# Patient Record
Sex: Female | Born: 1955 | Race: White | Hispanic: No | Marital: Married | Smoking: Never smoker
Health system: Southern US, Community
[De-identification: ages and names within clinical notes are randomized; demographics above are authoritative.]

## PROBLEM LIST (undated history)

## (undated) DIAGNOSIS — E079 Disorder of thyroid, unspecified: Secondary | ICD-10-CM

## (undated) DIAGNOSIS — F32A Depression, unspecified: Secondary | ICD-10-CM

## (undated) DIAGNOSIS — F419 Anxiety disorder, unspecified: Secondary | ICD-10-CM

## (undated) DIAGNOSIS — F329 Major depressive disorder, single episode, unspecified: Secondary | ICD-10-CM

## (undated) HISTORY — PX: REDUCTION MAMMAPLASTY: SUR839

## (undated) HISTORY — DX: Disorder of thyroid, unspecified: E07.9

## (undated) HISTORY — DX: Anxiety disorder, unspecified: F41.9

## (undated) HISTORY — DX: Major depressive disorder, single episode, unspecified: F32.9

## (undated) HISTORY — DX: Depression, unspecified: F32.A

---

## 1988-10-18 HISTORY — PX: COSMETIC SURGERY: SHX468

## 2000-10-18 HISTORY — PX: EYE SURGERY: SHX253

## 2000-10-18 HISTORY — PX: GASTRIC BYPASS: SHX52

## 2001-10-18 HISTORY — PX: COSMETIC SURGERY: SHX468

## 2003-10-19 DIAGNOSIS — E079 Disorder of thyroid, unspecified: Secondary | ICD-10-CM

## 2003-10-19 HISTORY — DX: Disorder of thyroid, unspecified: E07.9

## 2014-03-04 ENCOUNTER — Encounter: Payer: Self-pay | Admitting: *Deleted

## 2014-03-20 ENCOUNTER — Encounter: Payer: Self-pay | Admitting: *Deleted

## 2014-03-20 ENCOUNTER — Encounter: Payer: Self-pay | Admitting: Family Medicine

## 2014-03-20 ENCOUNTER — Ambulatory Visit (INDEPENDENT_AMBULATORY_CARE_PROVIDER_SITE_OTHER): Payer: 59 | Admitting: Family Medicine

## 2014-03-20 VITALS — BP 128/84 | HR 62 | Temp 97.5°F | Resp 12 | Ht 64.5 in | Wt 243.0 lb

## 2014-03-20 DIAGNOSIS — E669 Obesity, unspecified: Secondary | ICD-10-CM | POA: Insufficient documentation

## 2014-03-20 DIAGNOSIS — Z1231 Encounter for screening mammogram for malignant neoplasm of breast: Secondary | ICD-10-CM

## 2014-03-20 DIAGNOSIS — G8929 Other chronic pain: Secondary | ICD-10-CM

## 2014-03-20 DIAGNOSIS — Z0000 Encounter for general adult medical examination without abnormal findings: Secondary | ICD-10-CM

## 2014-03-20 DIAGNOSIS — M549 Dorsalgia, unspecified: Secondary | ICD-10-CM

## 2014-03-20 DIAGNOSIS — E039 Hypothyroidism, unspecified: Secondary | ICD-10-CM

## 2014-03-20 MED ORDER — HYDROCODONE-ACETAMINOPHEN 5-325 MG PO TABS: 1.0000 | ORAL_TABLET | Freq: Four times a day (QID) | ORAL | Status: DC | PRN

## 2014-03-20 NOTE — Assessment & Plan Note (Addendum)
PAP due in 2017 Colonoscopy UTD Obtain records from previous PCP/GYN Fasting labs pt will return for Schedule Mammogram

## 2014-03-20 NOTE — Progress Notes (Signed)
Patient ID: Katherine Woodward, female   DOB: 07/29/1956, 58 y.o.   MRN: [id number]   Subjective:    Patient ID: Katherine Woodward, female    DOB: 1956-08-01, 58 y.o.   MRN: [id number]  Patient presents for New Patient CPE and Medication refill  patient here for new physical exam. She moved here from [place] about 6 months ago. Her previous PCP was Dr. Rosary Lively. She was also followed by GYN and her Pap smear was done in January 2014 she is on a three-year cycle. She has had an ablation and is now in menopause. Medications and history reviewed History of mood disorder she's been on Wellbutrin since age 69 she would have anger issues as well as a lot of stressors and this is the only medication is help. At age 78 she was started on Zoloft secondary to hormonal changes which also affected her mood and some mild anxiety. She's wondering if she will be able to come off of this medication soon. History of hypothyroidism diagnosed about 7 years ago she's never had a biopsy she's been on levothyroxine and has been stable on her current dose for the past couple of years.  Chronic back pain she is history of chronic back pain since her 7s. She takes Flexeril when her back flares up on her she also has hydrocodone at home which she typically gets 30 tablets for the year she does not have that many flares. She also has episodes of bursitis in her right hip which she takes omega-3 fatty acids   She's had an early bone density which is been normal. She also had a colonoscopy at age 9 which was normal. She believes that she did have her tetanus booster.  Obesity-she had gastric bypass surgery back in 2002, she went down to approximately 170 pounds. Should not have any complications from the surgery. Over the past 5 years she has gained back all of her weight. She contributes this to stressors with work and scheduling difficulties where she stopped working out and watching her eating habits as closely. She works from home her  husband's job relocated them to [place]:  GEN- denies fatigue, fever, weight loss,weakness, recent illness HEENT- denies eye drainage, change in vision, nasal discharge, CVS- denies chest pain, palpitations RESP- denies SOB, cough, wheeze ABD- denies N/V, change in stools, abd pain GU- denies dysuria, hematuria, dribbling, incontinence MSK- + joint pain, muscle aches, injury Neuro- denies headache, dizziness, syncope, seizure activity       Objective:    BP 128/84  Pulse 62  Temp(Src) 97.5 F (36.4 C) (Oral)  Resp 12  Ht 5' 4.5" (1.638 m)  Wt 243 lb (110.224 kg)  BMI 41.08 kg/m2 GEN- NAD, alert and oriented x3,obese HEENT- PERRL, EOMI, non injected sclera, pink conjunctiva, MMM, oropharynx clear, fundus benign, good dentition Neck- Supple, no thyromegaly CVS- RRR, no murmur RESP-CTAB ABD-NABS,soft,NT,ND MSK- Good ROM Upper and Lower ext, Spine NT EXT- No edema Psych- normal affect and mood Pulses- Radial, DP- 2+        Assessment & Plan:      Problem List Items Addressed This Visit   None      Note: This dictation was prepared with Dragon dictation along with smaller phrase technology. Any transcriptional errors that result from this process are unintentional.

## 2014-03-20 NOTE — Assessment & Plan Note (Signed)
Given script for 30 tabs norco Has flexeril at home

## 2014-03-20 NOTE — Assessment & Plan Note (Signed)
Check TFT, needs every 6 months

## 2014-03-20 NOTE — Assessment & Plan Note (Signed)
Discussed diet and exercise 

## 2014-03-20 NOTE — Patient Instructions (Signed)
Release of records- Dr. Rosary Lively  Continue current medications Mammogram to be scheduled Return for fasting labs  F/U 6 months

## 2014-03-25 ENCOUNTER — Other Ambulatory Visit: Payer: 59

## 2014-03-25 DIAGNOSIS — Z0000 Encounter for general adult medical examination without abnormal findings: Secondary | ICD-10-CM

## 2014-03-25 DIAGNOSIS — E039 Hypothyroidism, unspecified: Secondary | ICD-10-CM

## 2014-03-25 LAB — T4, FREE: Free T4: 1.06 ng/dL (ref 0.80–1.80)

## 2014-03-25 LAB — CBC WITH DIFFERENTIAL/PLATELET
Basophils Absolute: 0.1 10*3/uL (ref 0.0–0.1)
Basophils Relative: 1 % (ref 0–1)
Eosinophils Absolute: 0.1 10*3/uL (ref 0.0–0.7)
Eosinophils Relative: 2 % (ref 0–5)
HEMATOCRIT: 37.6 % (ref 36.0–46.0)
Hemoglobin: 11.8 g/dL — ABNORMAL LOW (ref 12.0–15.0)
Lymphocytes Relative: 23 % (ref 12–46)
Lymphs Abs: 1.5 10*3/uL (ref 0.7–4.0)
MCH: 24.8 pg — ABNORMAL LOW (ref 26.0–34.0)
MCHC: 31.4 g/dL (ref 30.0–36.0)
MCV: 79.2 fL (ref 78.0–100.0)
MONOS PCT: 5 % (ref 3–12)
Monocytes Absolute: 0.3 10*3/uL (ref 0.1–1.0)
Neutro Abs: 4.4 10*3/uL (ref 1.7–7.7)
Neutrophils Relative %: 69 % (ref 43–77)
Platelets: 366 10*3/uL (ref 150–400)
RBC: 4.75 MIL/uL (ref 3.87–5.11)
RDW: 15.8 % — AB (ref 11.5–15.5)
WBC: 6.4 10*3/uL (ref 4.0–10.5)

## 2014-03-25 LAB — LIPID PANEL
CHOLESTEROL: 245 mg/dL — AB (ref 0–200)
HDL: 69 mg/dL (ref >39)
LDL Cholesterol: 156 mg/dL — ABNORMAL HIGH (ref 0–99)
Total CHOL/HDL Ratio: 3.6 Ratio
Triglycerides: 101 mg/dL (ref <150)
VLDL: 20 mg/dL (ref 0–40)

## 2014-03-25 LAB — COMPREHENSIVE METABOLIC PANEL
ALBUMIN: 3.9 g/dL (ref 3.5–5.2)
ALT: 14 U/L (ref 0–35)
AST: 15 U/L (ref 0–37)
Alkaline Phosphatase: 133 U/L — ABNORMAL HIGH (ref 39–117)
BUN: 17 mg/dL (ref 6–23)
CHLORIDE: 103 meq/L (ref 96–112)
CO2: 27 meq/L (ref 19–32)
Calcium: 9.2 mg/dL (ref 8.4–10.5)
Creat: 0.92 mg/dL (ref 0.50–1.10)
Glucose, Bld: 94 mg/dL (ref 70–99)
POTASSIUM: 4.5 meq/L (ref 3.5–5.3)
SODIUM: 138 meq/L (ref 135–145)
TOTAL PROTEIN: 7.2 g/dL (ref 6.0–8.3)
Total Bilirubin: 0.3 mg/dL (ref 0.2–1.2)

## 2014-03-25 LAB — TSH: TSH: 2.523 u[IU]/mL (ref 0.350–4.500)

## 2014-03-25 LAB — T3, FREE: T3, Free: 2.7 pg/mL (ref 2.3–4.2)

## 2014-04-01 ENCOUNTER — Other Ambulatory Visit: Payer: Self-pay | Admitting: *Deleted

## 2014-04-01 MED ORDER — SERTRALINE HCL 50 MG PO TABS: 50.0000 mg | ORAL_TABLET | Freq: Every day | ORAL | Status: DC

## 2014-04-01 MED ORDER — LEVOTHYROXINE SODIUM 112 MCG PO TABS: 112.0000 ug | ORAL_TABLET | Freq: Every day | ORAL | Status: DC

## 2014-04-01 MED ORDER — BUPROPION HCL ER (XL) 300 MG PO TB24: 300.0000 mg | ORAL_TABLET | Freq: Every day | ORAL | Status: DC

## 2014-04-03 ENCOUNTER — Telehealth: Payer: Self-pay | Admitting: *Deleted

## 2014-04-03 NOTE — Telephone Encounter (Signed)
Message copied by Sheral Flow on Wed Apr 03, 2014 11:46 AM ------      Message from: Devoria Glassing      Created: Wed Apr 03, 2014 11:36 AM       Patient would like to speak with you about her last office visit       Please call her at [phone] ------

## 2014-04-03 NOTE — Telephone Encounter (Signed)
Call returned to patient.   States that she has questions about her bill. Her insurance was billed $241 for her last visit (new patient CPE).   Advised that she would need to speak with office manager, but that he is currently on vacation.

## 2014-04-18 ENCOUNTER — Ambulatory Visit (HOSPITAL_COMMUNITY): Payer: Self-pay

## 2014-04-25 ENCOUNTER — Ambulatory Visit (HOSPITAL_COMMUNITY)
Admission: RE | Admit: 2014-04-25 | Discharge: 2014-04-25 | Disposition: A | Discharge status: Home / Self Care | Payer: 59 | Source: Ambulatory Visit | Attending: Family Medicine | Admitting: Family Medicine

## 2014-04-25 DIAGNOSIS — Z1231 Encounter for screening mammogram for malignant neoplasm of breast: Secondary | ICD-10-CM | POA: Insufficient documentation

## 2014-04-25 NOTE — Addendum Note (Signed)
Addended by: Vic Blackbird F on: 04/25/2014 12:33 AM   Modules accepted: Level of Service

## 2014-09-20 ENCOUNTER — Ambulatory Visit: Payer: 59 | Admitting: Family Medicine

## 2015-03-14 ENCOUNTER — Other Ambulatory Visit: Payer: Managed Care, Other (non HMO)

## 2015-03-14 DIAGNOSIS — E039 Hypothyroidism, unspecified: Secondary | ICD-10-CM

## 2015-03-14 DIAGNOSIS — Z79899 Other long term (current) drug therapy: Secondary | ICD-10-CM

## 2015-03-14 DIAGNOSIS — F39 Unspecified mood [affective] disorder: Secondary | ICD-10-CM

## 2015-03-14 LAB — COMPLETE METABOLIC PANEL WITH GFR
ALT: 11 U/L (ref 0–35)
AST: 13 U/L (ref 0–37)
Albumin: 3.8 g/dL (ref 3.5–5.2)
Alkaline Phosphatase: 120 U/L — ABNORMAL HIGH (ref 39–117)
BUN: 18 mg/dL (ref 6–23)
CO2: 27 mEq/L (ref 19–32)
CREATININE: 0.78 mg/dL (ref 0.50–1.10)
Calcium: 9.1 mg/dL (ref 8.4–10.5)
Chloride: 102 mEq/L (ref 96–112)
GFR, EST NON AFRICAN AMERICAN: 85 mL/min
GFR, Est African American: >89 mL/min
Glucose, Bld: 103 mg/dL — ABNORMAL HIGH (ref 70–99)
Potassium: 4.2 mEq/L (ref 3.5–5.3)
Sodium: 136 mEq/L (ref 135–145)
Total Bilirubin: 0.4 mg/dL (ref 0.2–1.2)
Total Protein: 7.1 g/dL (ref 6.0–8.3)

## 2015-03-14 LAB — LIPID PANEL
CHOL/HDL RATIO: 3.7 ratio
Cholesterol: 233 mg/dL — ABNORMAL HIGH (ref 0–200)
HDL: 63 mg/dL (ref >=46)
LDL Cholesterol: 146 mg/dL — ABNORMAL HIGH (ref 0–99)
TRIGLYCERIDES: 120 mg/dL (ref <150)
VLDL: 24 mg/dL (ref 0–40)

## 2015-03-14 LAB — CBC WITH DIFFERENTIAL/PLATELET
BASOS PCT: 1 % (ref 0–1)
Basophils Absolute: 0.1 10*3/uL (ref 0.0–0.1)
EOS ABS: 0.1 10*3/uL (ref 0.0–0.7)
EOS PCT: 2 % (ref 0–5)
HCT: 36.8 % (ref 36.0–46.0)
HEMOGLOBIN: 11.2 g/dL — AB (ref 12.0–15.0)
LYMPHS ABS: 1.5 10*3/uL (ref 0.7–4.0)
Lymphocytes Relative: 25 % (ref 12–46)
MCH: 24.7 pg — ABNORMAL LOW (ref 26.0–34.0)
MCHC: 30.4 g/dL (ref 30.0–36.0)
MCV: 81.1 fL (ref 78.0–100.0)
MONO ABS: 0.4 10*3/uL (ref 0.1–1.0)
MPV: 10.5 fL (ref 8.6–12.4)
Monocytes Relative: 6 % (ref 3–12)
Neutro Abs: 3.9 10*3/uL (ref 1.7–7.7)
Neutrophils Relative %: 66 % (ref 43–77)
Platelets: 354 10*3/uL (ref 150–400)
RBC: 4.54 MIL/uL (ref 3.87–5.11)
RDW: 16 % — ABNORMAL HIGH (ref 11.5–15.5)
WBC: 5.9 10*3/uL (ref 4.0–10.5)

## 2015-03-14 LAB — TSH: TSH: 2.394 u[IU]/mL (ref 0.350–4.500)

## 2015-03-26 ENCOUNTER — Encounter: Payer: Self-pay | Admitting: Family Medicine

## 2015-03-26 ENCOUNTER — Ambulatory Visit (INDEPENDENT_AMBULATORY_CARE_PROVIDER_SITE_OTHER): Payer: Managed Care, Other (non HMO) | Admitting: Family Medicine

## 2015-03-26 VITALS — BP 128/78 | HR 64 | Temp 97.7°F | Resp 14 | Ht 65.0 in | Wt 253.0 lb

## 2015-03-26 DIAGNOSIS — F39 Unspecified mood [affective] disorder: Secondary | ICD-10-CM | POA: Diagnosis not present

## 2015-03-26 DIAGNOSIS — Z124 Encounter for screening for malignant neoplasm of cervix: Secondary | ICD-10-CM

## 2015-03-26 DIAGNOSIS — G8929 Other chronic pain: Secondary | ICD-10-CM

## 2015-03-26 DIAGNOSIS — N898 Other specified noninflammatory disorders of vagina: Secondary | ICD-10-CM | POA: Diagnosis not present

## 2015-03-26 DIAGNOSIS — Z1239 Encounter for other screening for malignant neoplasm of breast: Secondary | ICD-10-CM

## 2015-03-26 DIAGNOSIS — Z0000 Encounter for general adult medical examination without abnormal findings: Secondary | ICD-10-CM

## 2015-03-26 DIAGNOSIS — M549 Dorsalgia, unspecified: Secondary | ICD-10-CM

## 2015-03-26 DIAGNOSIS — E038 Other specified hypothyroidism: Secondary | ICD-10-CM | POA: Diagnosis not present

## 2015-03-26 DIAGNOSIS — Z23 Encounter for immunization: Secondary | ICD-10-CM | POA: Diagnosis not present

## 2015-03-26 LAB — WET PREP FOR TRICH, YEAST, CLUE
Clue Cells Wet Prep HPF POC: NONE SEEN
Trich, Wet Prep: NONE SEEN
Yeast Wet Prep HPF POC: NONE SEEN

## 2015-03-26 MED ORDER — PHENTERMINE HCL 37.5 MG PO TABS: 37.5000 mg | ORAL_TABLET | Freq: Every day | ORAL | Status: DC

## 2015-03-26 MED ORDER — SERTRALINE HCL 25 MG PO TABS: 25.0000 mg | ORAL_TABLET | Freq: Every day | ORAL | Status: DC

## 2015-03-26 NOTE — Assessment & Plan Note (Signed)
CPE done, TDAP given, PAP Smear done, Mammo to be scheduled, reviewed fasting labs

## 2015-03-26 NOTE — Patient Instructions (Signed)
I recommend eye visit once a year I recommend dental visit every 6 months Goal is to  Exercise 30 minutes 5 days a week We will send a letter with lab results  Start phentermine- start with 1/2 tablet for 2 weeks, in the morning, then increase to 1 full tablet For zoloft- we are going to taper off, decrease to 1/2 tablet ( 25mg ) for 2 weeks, then take 1/2 tablet of the 25mg ( 12.5mg ) for 2 weeks, then stop TDAP given Schedule Mammogram in July I recommend Multivitamin once a day  F/U  8 weeks

## 2015-03-26 NOTE — Assessment & Plan Note (Signed)
TFT wnl no change to dose

## 2015-03-26 NOTE — Progress Notes (Signed)
Patient ID: Katherine Woodward, female   DOB: Feb 10, 1956, 59 y.o.   MRN: [id number]   Subjective:    Patient ID: Katherine Woodward, female    DOB: [birth year], 59 y.o.   MRN: [id number]  Patient presents for CPE with PAP  Last PAP 2 years ago, history of ablation for DUB. Mammogram due in July, colonoscopy UTD. Due for TDAP today [name] labs reviewed She would like to taper off zoloft, doing well with mood, has settled here well. Will continue taking Wellbutrin  Obesity- has gained 10lbs since our last visit, works from home, does not exercise, tries to follow her nutrition plan given by bariatric clinic but does drink too much soda and at times snacks. 24 recall- egg, muffin cheese, breafast- lunch half a sandwhich/fruit,,  Had mid evening snack, chicken veggies for lunch. Would like to try a weight loss pill to help her metabolism    Review Of Systems:  GEN- denies fatigue, fever, weight loss,weakness, recent illness HEENT- denies eye drainage, change in vision, nasal discharge, CVS- denies chest pain, palpitations RESP- denies SOB, cough, wheeze ABD- denies N/V, change in stools, abd pain GU- denies dysuria, hematuria, dribbling, incontinence MSK- denies joint pain, muscle aches, injury Neuro- denies headache, dizziness, syncope, seizure activity       Objective:    BP 128/78 mmHg  Pulse 64  Temp(Src) 97.7 F (36.5 C) (Oral)  Resp 14  Ht 5\' 5"  (1.651 m)  Wt 253 lb (114.76 kg)  BMI 42.10 kg/m2 GEN- NAD, alert and oriented x3,obese  HEENT- PERRL, EOMI, non injected sclera, pink conjunctiva, MMM, oropharynx clear Neck- Supple, no thyromegaly Breast- normal symmetry, no nipple inversion,no nipple drainage, no nodules or lumps felt Nodes- no axillary nodes CVS- RRR, no murmur RESP-CTAB ABD-NABS,soft,NT,ND GU- normal external genitalia, vaginal mucosa pink and moist, cervix partially  visualized no growth, tissue appeared a inflammed,( exam unconvertable for pt )no blood form os, +  discharge, no CMT, no ovarian masses, uterus difficult to palpate due to habitus Rectum- Normal tone, FOBT negat, Skin- muiltple moles, Seb Keratosis, has bruise 2cm Right upper ext  Psych- normal affect and mood EXT- No edema Pulses- Radial, DP- 2+        Assessment & Plan:      Problem List Items Addressed This Visit    Routine general medical examination at a health care facility - Primary   Morbid obesity   Relevant Medications   phentermine (ADIPEX-P) 37.5 MG tablet   Mood disorder   Hypothyroidism   Chronic back pain    Other Visit Diagnoses    Breast cancer screening        Relevant Orders    MM DIGITAL SCREENING BILATERAL    Cervical cancer screening        Relevant Orders    PAP, ThinPrep ASCUS Rflx HPV Rflx Type    Vaginal discharge        Wet prep negative    Relevant Orders    WET PREP FOR Katherine Woodward, YEAST, CLUE (Completed)    Need for prophylactic vaccination with combined diphtheria-tetanus-pertussis (DTP) vaccine           Note: This dictation was prepared with Dragon dictation along with smaller phrase technology. Any transcriptional errors that result from this process are unintentional.

## 2015-03-26 NOTE — Assessment & Plan Note (Signed)
Status post gastric bypass in the past now is regaining weight. Will give her trial of phentermine we also discussed calorie counting and she will use that For this. We will follow-up in interim on her weight loss. She is also to walk

## 2015-03-26 NOTE — Assessment & Plan Note (Signed)
We will taper her off of the Zoloft and continue with the Wellbutrin. Taper is plan to decrease her by 50% every couple weeks

## 2015-03-27 ENCOUNTER — Other Ambulatory Visit: Payer: Self-pay | Admitting: Family Medicine

## 2015-03-27 LAB — PAP THINPREP ASCUS RFLX HPV RFLX TYPE

## 2015-03-27 NOTE — Telephone Encounter (Signed)
Refill denied.   Patient is tapering off Zoloft.   New tapering dose sent to pharmacy on 03/26/2015.

## 2015-04-01 ENCOUNTER — Telehealth: Payer: Self-pay | Admitting: Family Medicine

## 2015-04-01 ENCOUNTER — Other Ambulatory Visit: Payer: Self-pay | Admitting: Family Medicine

## 2015-04-01 DIAGNOSIS — Z1231 Encounter for screening mammogram for malignant neoplasm of breast: Secondary | ICD-10-CM

## 2015-04-01 NOTE — Telephone Encounter (Signed)
noted 

## 2015-04-01 NOTE — Telephone Encounter (Signed)
[facility] calling to let us know that they sent a new order over for this patient that needs to be esigned  [phone] if any questions

## 2015-04-18 ENCOUNTER — Other Ambulatory Visit: Payer: Self-pay | Admitting: Family Medicine

## 2015-04-18 NOTE — Telephone Encounter (Signed)
LRF hydrocodone 03/21/15 #30  OK refill?

## 2015-04-18 NOTE — Telephone Encounter (Signed)
Okay to refill? 

## 2015-04-19 ENCOUNTER — Encounter: Payer: Self-pay | Admitting: Family Medicine

## 2015-04-22 ENCOUNTER — Other Ambulatory Visit: Payer: Self-pay | Admitting: Family Medicine

## 2015-04-22 MED ORDER — HYDROCODONE-ACETAMINOPHEN 5-325 MG PO TABS: 1.0000 | ORAL_TABLET | Freq: Four times a day (QID) | ORAL | Status: DC | PRN

## 2015-04-22 MED ORDER — SERTRALINE HCL 25 MG PO TABS: 25.0000 mg | ORAL_TABLET | Freq: Every day | ORAL | Status: DC

## 2015-04-23 NOTE — Telephone Encounter (Signed)
Medication refilled per protocol. 

## 2015-05-05 ENCOUNTER — Ambulatory Visit (HOSPITAL_COMMUNITY): Payer: Self-pay

## 2015-05-05 ENCOUNTER — Ambulatory Visit
Admission: RE | Admit: 2015-05-05 | Discharge: 2015-05-05 | Disposition: A | Discharge status: Home / Self Care | Payer: Managed Care, Other (non HMO) | Source: Ambulatory Visit | Attending: Family Medicine | Admitting: Family Medicine

## 2015-05-05 DIAGNOSIS — Z1239 Encounter for other screening for malignant neoplasm of breast: Secondary | ICD-10-CM

## 2015-05-21 ENCOUNTER — Ambulatory Visit (INDEPENDENT_AMBULATORY_CARE_PROVIDER_SITE_OTHER): Payer: Managed Care, Other (non HMO) | Admitting: Family Medicine

## 2015-05-21 ENCOUNTER — Encounter: Payer: Self-pay | Admitting: Family Medicine

## 2015-05-21 DIAGNOSIS — F39 Unspecified mood [affective] disorder: Secondary | ICD-10-CM

## 2015-05-21 MED ORDER — SERTRALINE HCL 50 MG PO TABS: 50.0000 mg | ORAL_TABLET | Freq: Every day | ORAL | Status: DC

## 2015-05-21 NOTE — Assessment & Plan Note (Signed)
Continue zoloft at 50mg , along with wellbutrin

## 2015-05-21 NOTE — Progress Notes (Signed)
Patient ID: Katherine Woodward, female   DOB: 11-27-1955, 59 y.o.   MRN: [id number]   Subjective:    Patient ID: Katherine Woodward, female    DOB: 1955/11/28, 59 y.o.   MRN: [id number]  Patient presents for 2 month F/U  patient here to follow-up weight. She started phentermine however it starts that make her a little short of breath with exercise and she would get very fatigued when she came off of the medication. She took for 5 weeks the past 3 weeks she has been off of the medication and she has been maintaining around a 1200-calorie diet. She has lost 17 pounds. She is doing aerobic exercise 3 days a week.  With regards to the Zoloft to try to taper off the found that it made her more anxious therefore she tapered back up to 50 mg somewhat to stay on this dose.  She is traveling out of the country in March she needs some help with travel vaccinations.  Review Of Systems:  GEN- denies fatigue, fever, weight loss,weakness, recent illness HEENT- denies eye drainage, change in vision, nasal discharge, CVS- denies chest pain, palpitations RESP- denies SOB, cough, wheeze ABD- denies N/V, change in stools, abd pain GU- denies dysuria, hematuria, dribbling, incontinence MSK- denies joint pain, muscle aches, injury Neuro- denies headache, dizziness, syncope, seizure activity       Objective:    BP 120/64 mmHg  Pulse 72  Temp(Src) 97.9 F (36.6 C) (Oral)  Resp 12  Ht 5\' 5"  (1.651 m)  Wt 236 lb (107.049 kg)  BMI 39.27 kg/m2 GEN- NAD, alert and oriented x3 Psych- normal affect and mood       Assessment & Plan:      Problem List Items Addressed This Visit    Morbid obesity - Primary    Encouraged to continue with weight loss, calorie counting , increase aerobic exercise to 5 days a week D/c phentermine due to side effects      Mood disorder    Continue zoloft at 50mg , along with wellbutrin         Note: This dictation was prepared with Dragon dictation along with smaller phrase  technology. Any transcriptional errors that result from this process are unintentional.

## 2015-05-21 NOTE — Patient Instructions (Addendum)
Continue with your weight loss and calorie counting  Continue zoloft 50mg   F/U June for physical or as needed

## 2015-05-21 NOTE — Assessment & Plan Note (Signed)
Encouraged to continue with weight loss, calorie counting , increase aerobic exercise to 5 days a week D/c phentermine due to side effects

## 2015-05-23 ENCOUNTER — Encounter: Payer: Self-pay | Admitting: Family Medicine

## 2015-05-24 ENCOUNTER — Other Ambulatory Visit: Payer: Self-pay | Admitting: Family Medicine

## 2015-05-26 NOTE — Telephone Encounter (Signed)
Refill appropriate and filled per protocol. 

## 2015-06-12 ENCOUNTER — Encounter: Payer: Self-pay | Admitting: Family Medicine

## 2015-06-12 MED ORDER — CYCLOBENZAPRINE HCL 10 MG PO TABS: 10.0000 mg | ORAL_TABLET | Freq: Every day | ORAL | Status: DC

## 2015-11-07 ENCOUNTER — Encounter: Payer: Self-pay | Admitting: Family Medicine

## 2015-11-10 MED ORDER — PHENTERMINE HCL 37.5 MG PO TABS: 37.5000 mg | ORAL_TABLET | Freq: Every day | ORAL | Status: DC

## 2016-01-26 ENCOUNTER — Telehealth: Payer: Self-pay | Admitting: *Deleted

## 2016-01-26 NOTE — Telephone Encounter (Signed)
Received request from pharmacy for PA on Phentermine.   PA submitted.   Dx: E66.01- morbid obesity.   BMI: 39.4.  PA approved 12/27/2015- 01/25/2017.  Case [id number].  Pharmacy made aware.

## 2016-04-16 ENCOUNTER — Other Ambulatory Visit: Payer: Managed Care, Other (non HMO)

## 2016-04-16 ENCOUNTER — Other Ambulatory Visit: Payer: Self-pay | Admitting: Family Medicine

## 2016-04-16 DIAGNOSIS — Z0000 Encounter for general adult medical examination without abnormal findings: Secondary | ICD-10-CM

## 2016-04-16 DIAGNOSIS — E039 Hypothyroidism, unspecified: Secondary | ICD-10-CM

## 2016-04-16 DIAGNOSIS — E669 Obesity, unspecified: Secondary | ICD-10-CM

## 2016-04-16 DIAGNOSIS — Z79899 Other long term (current) drug therapy: Secondary | ICD-10-CM

## 2016-04-16 DIAGNOSIS — F39 Unspecified mood [affective] disorder: Secondary | ICD-10-CM

## 2016-04-16 LAB — CBC WITH DIFFERENTIAL/PLATELET
BASOS ABS: 58 {cells}/uL (ref 0–200)
Basophils Relative: 1 %
Eosinophils Absolute: 116 cells/uL (ref 15–500)
Eosinophils Relative: 2 %
HCT: 38.1 % (ref 35.0–45.0)
Hemoglobin: 11.8 g/dL — ABNORMAL LOW (ref 12.0–15.0)
LYMPHS ABS: 1624 {cells}/uL (ref 850–3900)
LYMPHS PCT: 28 %
MCH: 25.6 pg — ABNORMAL LOW (ref 27.0–33.0)
MCHC: 31 g/dL — ABNORMAL LOW (ref 32.0–36.0)
MCV: 82.6 fL (ref 80.0–100.0)
MONO ABS: 406 {cells}/uL (ref 200–950)
MPV: 10.5 fL (ref 7.5–12.5)
Monocytes Relative: 7 %
NEUTROS PCT: 62 %
Neutro Abs: 3596 cells/uL (ref 1500–7800)
PLATELETS: 336 10*3/uL (ref 140–400)
RBC: 4.61 MIL/uL (ref 3.80–5.10)
RDW: 15.5 % — AB (ref 11.0–15.0)
WBC: 5.8 10*3/uL (ref 3.8–10.8)

## 2016-04-16 LAB — COMPLETE METABOLIC PANEL WITH GFR
ALT: 11 U/L (ref 6–29)
AST: 13 U/L (ref 10–35)
Albumin: 3.8 g/dL (ref 3.6–5.1)
Alkaline Phosphatase: 110 U/L (ref 33–130)
BILIRUBIN TOTAL: 0.4 mg/dL (ref 0.2–1.2)
BUN: 21 mg/dL (ref 7–25)
CALCIUM: 9.3 mg/dL (ref 8.6–10.4)
CHLORIDE: 102 mmol/L (ref 98–110)
CO2: 23 mmol/L (ref 20–31)
Creat: 0.87 mg/dL (ref 0.50–1.05)
GFR, EST AFRICAN AMERICAN: 85 mL/min (ref >=60)
GFR, EST NON AFRICAN AMERICAN: 74 mL/min (ref >=60)
Glucose, Bld: 100 mg/dL — ABNORMAL HIGH (ref 70–99)
Potassium: 4.4 mmol/L (ref 3.5–5.3)
Sodium: 138 mmol/L (ref 135–146)
Total Protein: 6.9 g/dL (ref 6.1–8.1)

## 2016-04-16 LAB — LIPID PANEL
CHOL/HDL RATIO: 3.1 ratio (ref <=5.0)
Cholesterol: 233 mg/dL — ABNORMAL HIGH (ref 125–200)
HDL: 74 mg/dL (ref >=46)
LDL CALC: 140 mg/dL — AB (ref <130)
TRIGLYCERIDES: 97 mg/dL (ref <150)
VLDL: 19 mg/dL (ref <30)

## 2016-04-16 LAB — TSH: TSH: 2.83 mIU/L

## 2016-04-19 LAB — HEMOGLOBIN A1C
Hgb A1c MFr Bld: 5.9 % — ABNORMAL HIGH (ref <5.7)
MEAN PLASMA GLUCOSE: 123 mg/dL

## 2016-04-21 ENCOUNTER — Encounter: Payer: Self-pay | Admitting: Family Medicine

## 2016-04-21 ENCOUNTER — Ambulatory Visit (INDEPENDENT_AMBULATORY_CARE_PROVIDER_SITE_OTHER): Payer: Managed Care, Other (non HMO) | Admitting: Family Medicine

## 2016-04-21 VITALS — BP 122/68 | HR 68 | Temp 98.3°F | Resp 12 | Ht 65.0 in | Wt 242.0 lb

## 2016-04-21 DIAGNOSIS — E038 Other specified hypothyroidism: Secondary | ICD-10-CM

## 2016-04-21 DIAGNOSIS — G8929 Other chronic pain: Secondary | ICD-10-CM | POA: Diagnosis not present

## 2016-04-21 DIAGNOSIS — F39 Unspecified mood [affective] disorder: Secondary | ICD-10-CM

## 2016-04-21 DIAGNOSIS — Z0000 Encounter for general adult medical examination without abnormal findings: Secondary | ICD-10-CM | POA: Diagnosis not present

## 2016-04-21 DIAGNOSIS — M549 Dorsalgia, unspecified: Secondary | ICD-10-CM | POA: Diagnosis not present

## 2016-04-21 MED ORDER — PHENTERMINE HCL 37.5 MG PO TABS: 37.5000 mg | ORAL_TABLET | Freq: Every day | ORAL | Status: DC

## 2016-04-21 MED ORDER — ZOSTER VACCINE LIVE 19400 UNT/0.65ML ~~LOC~~ SUSR: 0.6500 mL | Freq: Once | SUBCUTANEOUS | Status: DC

## 2016-04-21 MED ORDER — HYDROCODONE-ACETAMINOPHEN 5-325 MG PO TABS: 1.0000 | ORAL_TABLET | Freq: Four times a day (QID) | ORAL | Status: DC | PRN

## 2016-04-21 NOTE — Progress Notes (Signed)
Patient ID: Katherine Woodward, female   DOB: [birth year], 60 y.o.   MRN: [id number]   Subjective:    Patient ID: Katherine Woodward, female    DOB: 1956/03/07, 60 y.o.   MRN: [id number]  Patient presents for CPE Pt here for physical exam. Fasting labs reviewed History of Hypothyrodism, depression, morbid obesity  Mobid obesity- has gained 6lbs since our last visit. Had SE with phentermine Initially but then she was able to resume it. She took for about a month and a half and was able to lose down to 226 pounds however she would've a three-week cruise in the mother vacation to regain the weight she also stopped taking the medication at that same time knowing that she was not to be following her diet. She is now ready to resume taking the phentermine. Goal is 40 pounds weight loss History of depression and anxiety she is currently on Wellbutrin however she tapered herself off the Zoloft. She has noticed a little stressed but she is also in a new job position for the past 3 weeks. 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 She will also see how she does off of this medication.  Assessment refill on her pain medication for her chronic back pain she gets this once a year  PAP Smear- UTD Colonoscopy- UTD  Mammogram- UTD  Immunizations UTD     Review Of Systems:  GEN- denies fatigue, fever, weight loss,weakness, recent illness HEENT- denies eye drainage, change in vision, nasal discharge, CVS- denies chest pain, palpitations RESP- denies SOB, cough, wheeze ABD- denies N/V, change in stools, abd pain GU- denies dysuria, hematuria, dribbling, incontinence MSK- + occ joint pain, muscle aches, injury Neuro- denies headache, dizziness, syncope, seizure activity       Objective:    BP 122/68 mmHg  Pulse 68  Temp(Src) 98.3 F (36.8 C) (Oral)  Resp 12  Ht 5\' 5"  (1.651 m)  Wt 242 lb (109.77 kg)  BMI 40.27 kg/m2 GEN- NAD, alert and oriented x3 HEENT- PERRL, EOMI, non injected sclera, pink  conjunctiva, MMM, oropharynx clear Neck- Supple, no thyromegaly CVS- RRR, no murmur RESP-CTAB ABD-NABS,soft,NT,ND Psych- normal affect and mood GU- deferred EXT- No edema Pulses- Radial, DP- 2+        Assessment & Plan:      Problem List Items Addressed This Visit    Routine general medical examination at a health care facility - Primary    CPE done, shingles vaccine sent to pharmacy, pt to schedule mammogram      Morbid obesity ([place])    Restart phentermine, goal 40lb weight loss Discussed healthy eating D/C Coke beverage Start walking      Relevant Medications   phentermine (ADIPEX-P) 37.5 MG tablet   Mood disorder ([facility])    Continue the Wellbutrin for now we will see how she does off the Zoloft for another few weeks I think this may be adjusted transition state with her new job. If she finds that her anxiety OR overwhelming or she cannot handle stress and we will restart the Zoloft at 25mg       Hypothyroidism    Continue current dose of synthroid      Chronic back pain    Norco refilled, flexeril as prescribed      Relevant Medications   HYDROcodone-acetaminophen (NORCO/VICODIN) 5-325 MG tablet      Note: This dictation was prepared with Dragon dictation along with smaller phrase technology. Any transcriptional errors that result from this process are unintentional.

## 2016-04-21 NOTE — Patient Instructions (Addendum)
Restart phentermine  Work on healthy eating and weight loss  Schedule mammogram  Shingles vaccine sent to pharmacy  email if stress worsens and need to restart medication F/U 3 months for medication

## 2016-04-21 NOTE — Assessment & Plan Note (Signed)
Continue the Wellbutrin for now we will see how she does off the Zoloft for another few weeks I think this may be adjusted transition state with her new job. If she finds that her anxiety OR overwhelming or she cannot handle stress and we will restart the Zoloft at 25mg 

## 2016-04-21 NOTE — Assessment & Plan Note (Signed)
Restart phentermine, goal 40lb weight loss Discussed healthy eating D/C Coke beverage Start walking

## 2016-04-21 NOTE — Assessment & Plan Note (Signed)
Continue current dose of synthroid 

## 2016-04-21 NOTE — Assessment & Plan Note (Signed)
Norco refilled, flexeril as prescribed

## 2016-04-21 NOTE — Addendum Note (Signed)
Addended by: Sheral Flow on: 04/21/2016 01:13 PM   Modules accepted: Orders

## 2016-04-21 NOTE — Assessment & Plan Note (Signed)
CPE done, shingles vaccine sent to pharmacy, pt to schedule mammogram

## 2016-04-22 ENCOUNTER — Other Ambulatory Visit: Payer: Self-pay | Admitting: Family Medicine

## 2016-04-22 DIAGNOSIS — Z1231 Encounter for screening mammogram for malignant neoplasm of breast: Secondary | ICD-10-CM

## 2016-04-22 DIAGNOSIS — Z9889 Other specified postprocedural states: Secondary | ICD-10-CM

## 2016-04-22 NOTE — Telephone Encounter (Signed)
Refill appropriate and filled per protocol. 

## 2016-05-05 ENCOUNTER — Ambulatory Visit
Admission: RE | Admit: 2016-05-05 | Discharge: 2016-05-05 | Disposition: A | Discharge status: Home / Self Care | Payer: Managed Care, Other (non HMO) | Source: Ambulatory Visit | Attending: Family Medicine | Admitting: Family Medicine

## 2016-05-05 DIAGNOSIS — Z1231 Encounter for screening mammogram for malignant neoplasm of breast: Secondary | ICD-10-CM

## 2016-05-05 DIAGNOSIS — Z9889 Other specified postprocedural states: Secondary | ICD-10-CM

## 2016-07-02 ENCOUNTER — Other Ambulatory Visit: Payer: Self-pay | Admitting: Family Medicine

## 2016-07-02 MED ORDER — CYCLOBENZAPRINE HCL 10 MG PO TABS: 10.0000 mg | ORAL_TABLET | Freq: Every day | ORAL | 0 refills | Status: DC

## 2016-07-02 NOTE — Telephone Encounter (Signed)
Ok to refill 

## 2016-07-02 NOTE — Telephone Encounter (Signed)
okay

## 2016-07-03 ENCOUNTER — Other Ambulatory Visit: Payer: Self-pay | Admitting: Family Medicine

## 2017-03-11 ENCOUNTER — Telehealth: Payer: Self-pay | Admitting: Family Medicine

## 2017-03-11 MED ORDER — BUPROPION HCL ER (XL) 300 MG PO TB24: ORAL_TABLET | ORAL | 0 refills | Status: DC

## 2017-03-11 NOTE — Telephone Encounter (Signed)
Pt needs bupropion sent to [facility] on [address]

## 2017-03-11 NOTE — Telephone Encounter (Signed)
Medication filled x1 with no refills.   Requires office visit before any further refills can be given.   Letter sent.  

## 2017-03-15 ENCOUNTER — Other Ambulatory Visit: Payer: Self-pay | Admitting: *Deleted

## 2017-03-15 MED ORDER — BUPROPION HCL ER (XL) 300 MG PO TB24: ORAL_TABLET | ORAL | 0 refills | Status: DC

## 2017-04-07 ENCOUNTER — Other Ambulatory Visit: Payer: Self-pay | Admitting: Family Medicine

## 2017-04-12 ENCOUNTER — Other Ambulatory Visit: Payer: Self-pay | Admitting: Family Medicine

## 2017-04-12 DIAGNOSIS — Z0000 Encounter for general adult medical examination without abnormal findings: Secondary | ICD-10-CM

## 2017-04-12 DIAGNOSIS — E038 Other specified hypothyroidism: Secondary | ICD-10-CM

## 2017-04-25 ENCOUNTER — Other Ambulatory Visit: Payer: Managed Care, Other (non HMO)

## 2017-04-25 DIAGNOSIS — E038 Other specified hypothyroidism: Secondary | ICD-10-CM

## 2017-04-25 DIAGNOSIS — Z0000 Encounter for general adult medical examination without abnormal findings: Secondary | ICD-10-CM

## 2017-04-25 LAB — CBC WITH DIFFERENTIAL/PLATELET
BASOS ABS: 56 {cells}/uL (ref 0–200)
Basophils Relative: 1 %
EOS ABS: 112 {cells}/uL (ref 15–500)
Eosinophils Relative: 2 %
HEMATOCRIT: 40.8 % (ref 35.0–45.0)
HEMOGLOBIN: 13.1 g/dL (ref 12.0–15.0)
LYMPHS ABS: 1344 {cells}/uL (ref 850–3900)
LYMPHS PCT: 24 %
MCH: 29.2 pg (ref 27.0–33.0)
MCHC: 32.1 g/dL (ref 32.0–36.0)
MCV: 91.1 fL (ref 80.0–100.0)
MONO ABS: 336 {cells}/uL (ref 200–950)
MPV: 11.1 fL (ref 7.5–12.5)
Monocytes Relative: 6 %
NEUTROS PCT: 67 %
Neutro Abs: 3752 cells/uL (ref 1500–7800)
Platelets: 306 10*3/uL (ref 140–400)
RBC: 4.48 MIL/uL (ref 3.80–5.10)
RDW: 14 % (ref 11.0–15.0)
WBC: 5.6 10*3/uL (ref 3.8–10.8)

## 2017-04-25 LAB — COMPREHENSIVE METABOLIC PANEL
ALBUMIN: 3.9 g/dL (ref 3.6–5.1)
ALK PHOS: 109 U/L (ref 33–130)
ALT: 11 U/L (ref 6–29)
AST: 13 U/L (ref 10–35)
BILIRUBIN TOTAL: 0.4 mg/dL (ref 0.2–1.2)
BUN: 17 mg/dL (ref 7–25)
CALCIUM: 9.1 mg/dL (ref 8.6–10.4)
CO2: 20 mmol/L (ref 20–31)
Chloride: 104 mmol/L (ref 98–110)
Creat: 0.88 mg/dL (ref 0.50–1.05)
GLUCOSE: 90 mg/dL (ref 70–99)
POTASSIUM: 4.2 mmol/L (ref 3.5–5.3)
Sodium: 138 mmol/L (ref 135–146)
Total Protein: 6.9 g/dL (ref 6.1–8.1)

## 2017-04-25 LAB — LIPID PANEL
Cholesterol: 226 mg/dL — ABNORMAL HIGH (ref <200)
HDL: 77 mg/dL (ref >50)
LDL CALC: 133 mg/dL — AB (ref <100)
Total CHOL/HDL Ratio: 2.9 Ratio (ref <5.0)
Triglycerides: 82 mg/dL (ref <150)
VLDL: 16 mg/dL (ref <30)

## 2017-04-25 LAB — TSH: TSH: 1.47 m[IU]/L

## 2017-05-02 ENCOUNTER — Other Ambulatory Visit: Payer: Managed Care, Other (non HMO)

## 2017-05-04 ENCOUNTER — Encounter: Payer: Self-pay | Admitting: Family Medicine

## 2017-05-04 ENCOUNTER — Ambulatory Visit (INDEPENDENT_AMBULATORY_CARE_PROVIDER_SITE_OTHER): Payer: Managed Care, Other (non HMO) | Admitting: Family Medicine

## 2017-05-04 VITALS — BP 118/64 | HR 80 | Temp 97.8°F | Resp 16 | Ht 65.0 in | Wt 240.0 lb

## 2017-05-04 DIAGNOSIS — Z6839 Body mass index (BMI) 39.0-39.9, adult: Secondary | ICD-10-CM

## 2017-05-04 DIAGNOSIS — Z0000 Encounter for general adult medical examination without abnormal findings: Secondary | ICD-10-CM

## 2017-05-04 DIAGNOSIS — Z1231 Encounter for screening mammogram for malignant neoplasm of breast: Secondary | ICD-10-CM | POA: Diagnosis not present

## 2017-05-04 DIAGNOSIS — F39 Unspecified mood [affective] disorder: Secondary | ICD-10-CM

## 2017-05-04 DIAGNOSIS — G8929 Other chronic pain: Secondary | ICD-10-CM | POA: Diagnosis not present

## 2017-05-04 DIAGNOSIS — E785 Hyperlipidemia, unspecified: Secondary | ICD-10-CM | POA: Insufficient documentation

## 2017-05-04 DIAGNOSIS — M545 Low back pain, unspecified: Secondary | ICD-10-CM

## 2017-05-04 DIAGNOSIS — Z1239 Encounter for other screening for malignant neoplasm of breast: Secondary | ICD-10-CM

## 2017-05-04 DIAGNOSIS — E038 Other specified hypothyroidism: Secondary | ICD-10-CM

## 2017-05-04 MED ORDER — BUPROPION HCL ER (XL) 300 MG PO TB24: ORAL_TABLET | ORAL | 3 refills | Status: DC

## 2017-05-04 MED ORDER — CYCLOBENZAPRINE HCL 10 MG PO TABS: 10.0000 mg | ORAL_TABLET | Freq: Every day | ORAL | 0 refills | Status: DC

## 2017-05-04 MED ORDER — HYDROCODONE-ACETAMINOPHEN 5-325 MG PO TABS: 1.0000 | ORAL_TABLET | Freq: Four times a day (QID) | ORAL | 0 refills | Status: DC | PRN

## 2017-05-04 MED ORDER — BUPROPION HCL ER (XL) 300 MG PO TB24: ORAL_TABLET | ORAL | 0 refills | Status: DC

## 2017-05-04 MED ORDER — LEVOTHYROXINE SODIUM 112 MCG PO TABS: ORAL_TABLET | ORAL | 3 refills | Status: DC

## 2017-05-04 NOTE — Assessment & Plan Note (Addendum)
Pain medication Flexeril refill she only gets a prescription once a year no sign of any abuse or overuse of the medication.  Possible superficial pinched nerve or some irritation in the nerve in the muscles there and her shoulder. Her exam is normal. She can try topical anti-inflammatory no red flags.

## 2017-05-04 NOTE — Assessment & Plan Note (Signed)
Mood is stable on Wellbutrin

## 2017-05-04 NOTE — Assessment & Plan Note (Signed)
CPE done, reviewed fasting labs, cholesterol improved. She will be set up to have her mammogram done.

## 2017-05-04 NOTE — Progress Notes (Signed)
Subjective:    Patient ID: Katherine Woodward, female    DOB: August 17, 1956, 61 y.o.   MRN: [id number]  Patient presents for CPE (has had labs) Patient for complete physical exam. Her fasting labs are reviewed. History of updated medications reviewed  Chronic back pain she takes hydrocodone as needed gets 30 tablets typically about once a year. She also uses Flexeril as needed.  Hypothyroidism she is currently on Synthroid 112 g, recent TSH normal at 1.47  Mood disorder she has been maintained on Wellbutrin for many years does well with this.  Colonoscopy up-to-date. Mammogram done in July 2017 CT scheduled for this year.  Pap smear up-to-date due in June 2019  Fasting labs cholesterol has improved LDL down to 133 total cholesterol down to 226 Immunzaitons- UTD  Shingles done 2017   Obesity she has history of bariatric surgery. She's tried multiple other medications since then she has been unable to lose her weight. She states that she walks as yoga and aerobic exercise on regular basis. She is try low car low-fat she does have difficulty with getting a lot of veggies in but is still not been able to lose any significant weight.  She's also had a burning sensation near her shoulder blade on the left side for the past 6 months. No particular injury has normal range of motion of the shoulder. She has tried CBD oil as well as heat and ice with no improvement. It is random when the symptoms occur.  Review Of Systems:  GEN- denies fatigue, fever, weight loss,weakness, recent illness HEENT- denies eye drainage, change in vision, nasal discharge, CVS- denies chest pain, palpitations RESP- denies SOB, cough, wheeze ABD- denies N/V, change in stools, abd pain GU- denies dysuria, hematuria, dribbling, incontinence MSK- denies joint pain, muscle aches, injury Neuro- denies headache, dizziness, syncope, seizure activity       Objective:    BP 118/64   Pulse 80   Temp 97.8 F (36.6 C) (Oral)    Resp 16   Ht 5\' 5"  (1.651 m)   Wt 240 lb (108.9 kg)   SpO2 96%   BMI 39.94 kg/m  GEN- NAD, alert and oriented x3,obese  HEENT- PERRL, EOMI, non injected sclera, pink conjunctiva, MMM, oropharynx clear Neck- Supple, no thyromegaly CVS- RRR, no murmur RESP-CTAB ABD-NABS,soft,NT,ND Psych- normal affect and mood MSK- Normal inspection bilat shoulder UE, FROM upper ext, rotator cuff in tact, no winged scalpula, NT to palpation  EXT- No edema Pulses- Radial, DP- 2+        Assessment & Plan:      Problem List Items Addressed This Visit    Routine general medical examination at a health care facility - Primary    CPE done, reviewed fasting labs, cholesterol improved. She will be set up to have her mammogram done.      Obesity    Trial of Saxenda discussed Side effects of the medication Start with 0.6mg  titarte up to 3mg  Nurse showed pt how to inject medication in office       Mood disorder ([place])    Mood is stable on Wellbutrin      Mild hyperlipidemia   Hypothyroidism    Thyroid function at baseline no change to dose.      Relevant Medications   levothyroxine (SYNTHROID) 112 MCG tablet   Chronic back pain    Pain medication Flexeril refill she only gets a prescription once a year no sign of any abuse or overuse of the medication.  Possible superficial pinched nerve or some irritation in the nerve in the muscles there and her shoulder. Her exam is normal. She can try topical anti-inflammatory no red flags.      Relevant Medications   cyclobenzaprine (FLEXERIL) 10 MG tablet   HYDROcodone-acetaminophen (NORCO/VICODIN) 5-325 MG tablet    Other Visit Diagnoses    Breast cancer screening          Note: This dictation was prepared with Dragon dictation along with smaller phrase technology. Any transcriptional errors that result from this process are unintentional.

## 2017-05-04 NOTE — Assessment & Plan Note (Signed)
Trial of Saxenda discussed Side effects of the medication Start with 0.6mg  titarte up to 3mg  Nurse showed pt how to inject medication in office

## 2017-05-04 NOTE — Patient Instructions (Signed)
Start saxenda:  Week 1: 0.6mg  injected daily:   Week 2:  1.2 mg injected daily  Week 3:  1.8mg  injected daily  Week 4:  2.4mg  injected daily  Week 5:  3mg  injected daily Schedule your mammogram F/U 2 months for weight

## 2017-05-04 NOTE — Assessment & Plan Note (Addendum)
Thyroid function at baseline no change to dose.

## 2017-05-17 ENCOUNTER — Telehealth: Payer: Self-pay | Admitting: *Deleted

## 2017-05-17 NOTE — Telephone Encounter (Signed)
Received request from pharmacy for [place] on Saxenda.   PA submitted.   Dx: E66.01- obesity.  BMI: 40.

## 2017-05-17 NOTE — Telephone Encounter (Signed)
[facility] is reviewing your PA request and will respond within 24-72 hours. To check for an update later, open this request from your dashboard.   

## 2017-05-18 ENCOUNTER — Ambulatory Visit
Admission: RE | Admit: 2017-05-18 | Discharge: 2017-05-18 | Disposition: A | Discharge status: Home / Self Care | Payer: Managed Care, Other (non HMO) | Source: Ambulatory Visit | Attending: Family Medicine | Admitting: Family Medicine

## 2017-05-18 DIAGNOSIS — Z1239 Encounter for other screening for malignant neoplasm of breast: Secondary | ICD-10-CM

## 2017-05-19 ENCOUNTER — Encounter: Payer: Self-pay | Admitting: Family Medicine

## 2017-05-23 ENCOUNTER — Encounter: Payer: Self-pay | Admitting: *Deleted

## 2017-05-24 ENCOUNTER — Encounter: Payer: Self-pay | Admitting: *Deleted

## 2017-05-24 ENCOUNTER — Encounter: Payer: Self-pay | Admitting: Family Medicine

## 2017-05-24 ENCOUNTER — Ambulatory Visit (INDEPENDENT_AMBULATORY_CARE_PROVIDER_SITE_OTHER): Payer: Managed Care, Other (non HMO) | Admitting: Family Medicine

## 2017-05-24 VITALS — BP 118/62 | HR 80 | Temp 97.9°F | Resp 14 | Ht 66.0 in | Wt 231.0 lb

## 2017-05-24 DIAGNOSIS — R21 Rash and other nonspecific skin eruption: Secondary | ICD-10-CM | POA: Diagnosis not present

## 2017-05-24 MED ORDER — PREDNISONE 10 MG PO TABS: ORAL_TABLET | ORAL | 0 refills | Status: DC

## 2017-05-24 NOTE — Patient Instructions (Signed)
F/U as needed  Take prednisone as prescribed

## 2017-05-24 NOTE — Progress Notes (Signed)
   Subjective:    Patient ID: Katherine Woodward, female    DOB: 10-22-1955, 61 y.o.   MRN: [id number]  Patient presents for Rash (irritation to face and neck )   Rash started sunday on face and  Spread to neck 24 hours later. She does not recall anything new to her skin. She is not recalling contact with poison ivy or O but does have a pet. She does not have any rash anywhere else on her body. The rash is very itchy. She states when it first started look like pimples were coming up on her cheeks but then larger areas of rash started to appear she now has spot on her left eye lid is well. She denies any difficulty breathing difficulty swallowing. Her newest medication is Kirke Shaggy which was started 3 weeks ago she's actually been off of the medicine for the past 4 days that she is waiting for an authorization. She did not have any side effects with the medication starting.  She did take a dose of Benadryl but this did not help the itching as much.  Review Of Systems:  GEN- denies fatigue, fever, weight loss,weakness, recent illness HEENT- denies eye drainage, change in vision, nasal discharge, CVS- denies chest pain, palpitations RESP- denies SOB, cough, wheeze ABD- denies N/V, change in stools, abd pain Neuro- denies headache, dizziness, syncope, seizure activity       Objective:    BP 118/62   Pulse 80   Temp 97.9 F (36.6 C) (Oral)   Resp 14   Ht 5\' 6"  (1.676 m)   Wt 231 lb (104.8 kg)   SpO2 98%   BMI 37.28 kg/m  GEN- NAD, alert and oriented x3 HEENT- PERRL, EOMI, non injected sclera, pink conjunctiva, MMM, oropharynx clear Neck- Supple, no LAD  Skin- erythema with few papular lesions bilat cheeks, urticarial like rash extending down left side of face/neck/ behind left ear , eythema left upper eyelid, mild erythema in frontal scalp  RESP-normal WOB        Assessment & Plan:      Problem List Items Addressed This Visit    None    Visit Diagnoses    Rash and nonspecific skin  eruption    -  Primary   appears to be allergic reaction to something, doubt Saxenda based on distrubution, ? contact dermatitis unknown cause. Given Depo Medrol , prednisone taper      Note: This dictation was prepared with Dragon dictation along with smaller phrase technology. Any transcriptional errors that result from this process are unintentional.

## 2017-05-27 ENCOUNTER — Encounter: Payer: Self-pay | Admitting: Family Medicine

## 2017-05-27 MED ORDER — INSULIN PEN NEEDLE 32G X 6 MM MISC: 1 refills | Status: DC

## 2017-05-27 MED ORDER — LIRAGLUTIDE -WEIGHT MANAGEMENT 18 MG/3ML ~~LOC~~ SOPN: 3.0000 mg | PEN_INJECTOR | Freq: Every day | SUBCUTANEOUS | 3 refills | Status: DC

## 2017-05-27 NOTE — Telephone Encounter (Signed)
Call placed to insurance to F/U on appeal.   Received determination.   Approved 04/27/2017- 09/24/2017.

## 2017-05-30 ENCOUNTER — Encounter: Payer: Self-pay | Admitting: Family Medicine

## 2017-05-30 MED ORDER — HYDROXYZINE HCL 25 MG PO TABS: 25.0000 mg | ORAL_TABLET | Freq: Three times a day (TID) | ORAL | 0 refills | Status: DC | PRN

## 2017-06-01 ENCOUNTER — Ambulatory Visit (INDEPENDENT_AMBULATORY_CARE_PROVIDER_SITE_OTHER): Payer: Managed Care, Other (non HMO) | Admitting: Family Medicine

## 2017-06-01 ENCOUNTER — Encounter: Payer: Self-pay | Admitting: Family Medicine

## 2017-06-01 VITALS — BP 116/68 | HR 70 | Temp 97.8°F | Resp 16 | Ht 66.0 in | Wt 230.0 lb

## 2017-06-01 DIAGNOSIS — L239 Allergic contact dermatitis, unspecified cause: Secondary | ICD-10-CM

## 2017-06-01 MED ORDER — FAMOTIDINE 10 MG PO TABS: 10.0000 mg | ORAL_TABLET | Freq: Two times a day (BID) | ORAL | 0 refills | Status: DC

## 2017-06-01 MED ORDER — PREDNISONE 10 MG PO TABS: ORAL_TABLET | ORAL | 0 refills | Status: DC

## 2017-06-01 NOTE — Progress Notes (Signed)
   Subjective:    Patient ID: Katherine Woodward, female    DOB: 12-17-1955, 61 y.o.   MRN: [id number]  Patient presents for Itching  Pt here with persistant itching. Seen for rash last week, appeared to be some type of contact dermatitis unknown origin, given Depo Medrol 40mg  in office and prednisone taper, also taking benadryl. Itching persisted on face and few spots on arms though no rash there, sent in atarax this did not help.advised to come in for visit  Rash is improved on face and neck, but still present. She now remembers being in contact with shellfish at her home and thinks this triggered the rash , still no difficulty breathing or swallowing    Review Of Systems:  GEN- denies fatigue, fever, weight loss,weakness, recent illness HEENT- denies eye drainage, change in vision, nasal discharge, CVS- denies chest pain, palpitations RESP- denies SOB, cough, wheeze ABD- denies N/V, change in stools, abd pain GU- denies dysuria, hematuria, dribbling, incontinence MSK- denies joint pain, muscle aches, injury Neuro- denies headache, dizziness, syncope, seizure activity       Objective:    BP 116/68   Pulse 70   Temp 97.8 F (36.6 C) (Oral)   Resp 16   Ht 5\' 6"  (1.676 m)   Wt 230 lb (104.3 kg)   SpO2 98%   BMI 37.12 kg/m                 GEN- NAD, alert and oriented x3 HEENT- PERRL, EOMI, non injected sclera, pink conjunctiva, MMM, oropharynx clear Neck- Supple, no LAD  Skin- fading erythema  bilat cheeks, and  extending down left side of face/neck/ behind left ear ,  No swelling of eyelids , no lesions on arms or palms  RESP-normal WOB, CTAB        Assessment & Plan:      Problem List Items Addressed This Visit    None    Visit Diagnoses    Allergic dermatitis    -  Primary   now remembers with contact with shellfish which breaks her out will put back on higer dose of prednisone. add pepcid BID, continue atarax at bedtime      Note: This dictation was prepared  with Dragon dictation along with smaller phrase technology. Any transcriptional errors that result from this process are unintentional.

## 2017-06-01 NOTE — Patient Instructions (Addendum)
F/U Move F/U appt to mid October  Take the pepcid and the restart the steroids

## 2017-06-02 ENCOUNTER — Encounter: Payer: Self-pay | Admitting: Family Medicine

## 2017-06-20 ENCOUNTER — Other Ambulatory Visit: Payer: Self-pay | Admitting: Family Medicine

## 2017-06-21 NOTE — Telephone Encounter (Signed)
Refill appropriate 

## 2017-07-11 ENCOUNTER — Ambulatory Visit: Payer: Managed Care, Other (non HMO) | Admitting: Family Medicine

## 2017-07-11 ENCOUNTER — Ambulatory Visit (INDEPENDENT_AMBULATORY_CARE_PROVIDER_SITE_OTHER): Payer: Managed Care, Other (non HMO) | Admitting: Family Medicine

## 2017-07-11 ENCOUNTER — Encounter: Payer: Self-pay | Admitting: Family Medicine

## 2017-07-11 VITALS — BP 118/76 | HR 72 | Temp 98.0°F | Resp 18 | Wt 216.8 lb

## 2017-07-11 DIAGNOSIS — N390 Urinary tract infection, site not specified: Secondary | ICD-10-CM

## 2017-07-11 DIAGNOSIS — E6609 Other obesity due to excess calories: Secondary | ICD-10-CM | POA: Diagnosis not present

## 2017-07-11 DIAGNOSIS — R319 Hematuria, unspecified: Secondary | ICD-10-CM | POA: Diagnosis not present

## 2017-07-11 DIAGNOSIS — Z6834 Body mass index (BMI) 34.0-34.9, adult: Secondary | ICD-10-CM | POA: Diagnosis not present

## 2017-07-11 LAB — URINALYSIS, ROUTINE W REFLEX MICROSCOPIC
BILIRUBIN URINE: NEGATIVE
Glucose, UA: NEGATIVE
Nitrite: NEGATIVE
SPECIFIC GRAVITY, URINE: 1.025 (ref 1.001–1.03)
pH: 6 (ref 5.0–8.0)

## 2017-07-11 LAB — MICROSCOPIC MESSAGE

## 2017-07-11 MED ORDER — CEPHALEXIN 500 MG PO CAPS: 500.0000 mg | ORAL_CAPSULE | Freq: Four times a day (QID) | ORAL | 0 refills | Status: DC

## 2017-07-11 NOTE — Progress Notes (Signed)
   Subjective:    Patient ID: Katherine Woodward, female    DOB: 20-May-1956, 61 y.o.   MRN: [id number]  Patient presents for Urinary Tract Infection (urgency,frequency,discomfort)   Pt here with dysuria, frequency for the past days. She's not noted any blood in her urine. She has had some low back discomfort no abdominal pain. She recently returned from [place] at that time she had an upper respiratory infection but that is now improved. She did not have any fever.  Currently on Saxenda weight down 14 pounds since August , she still has significant nausea needs very small meals throughout the day. She's currently on 3 mg  Has had chronic blood in urine, seen by urology nothing found with work up in past   Review Of Systems:  GEN- denies fatigue, fever, weight loss,weakness, recent illness HEENT- denies eye drainage, change in vision, nasal discharge, CVS- denies chest pain, palpitations RESP- denies SOB, cough, wheeze ABD- denies N/V, change in stools, abd pain GU- +dysuria, denies hematuria, dribbling, incontinence MSK- denies joint pain, muscle aches, injury Neuro- denies headache, dizziness, syncope, seizure activity       Objective:    BP 118/76 (BP Location: Right Arm, Patient Position: Sitting, Cuff Size: Large)   Pulse 72   Temp 98 F (36.7 C) (Oral)   Resp 18   Wt 216 lb 12.8 oz (98.3 kg)   BMI 34.99 kg/m  GEN- NAD, alert and oriented x3 HEENT- PERRL, EOMI, non injected sclera, pink conjunctiva, MMM, oropharynx clear TM clear bilat no effusion, nares clear  CVS- RRR, no murmur RESP-CTAB ABD-NABS,soft,NT,ND, no CVA tenderness Pulses- Radial 2+        Assessment & Plan:      Problem List Items Addressed This Visit      Unprioritized   Obesity    Doing well with weight loss, keep hydrated Continue with Saxenda       Other Visit Diagnoses    Urinary tract infection with hematuria, site unspecified    -  Primary   Treat with keflex, push fluids, URI has resolved     Relevant Medications   cephALEXin (KEFLEX) 500 MG capsule   Other Relevant Orders   Urinalysis, Routine w reflex microscopic      Note: This dictation was prepared with Dragon dictation along with smaller phrase technology. Any transcriptional errors that result from this process are unintentional.

## 2017-07-11 NOTE — Assessment & Plan Note (Signed)
Doing well with weight loss, keep hydrated Continue with [place]

## 2017-07-11 NOTE — Patient Instructions (Addendum)
Change F/U To End of November for Weight  Decrease saxenda to 2.4mg  Antibiotics as prescribed Cancel Oct appointment

## 2017-07-13 ENCOUNTER — Other Ambulatory Visit: Payer: Self-pay | Admitting: *Deleted

## 2017-07-13 MED ORDER — SYNTHROID 112 MCG PO TABS: ORAL_TABLET | ORAL | 3 refills | Status: DC

## 2017-08-01 ENCOUNTER — Ambulatory Visit: Payer: Managed Care, Other (non HMO) | Admitting: Family Medicine

## 2017-08-23 ENCOUNTER — Ambulatory Visit: Payer: Managed Care, Other (non HMO) | Admitting: Family Medicine

## 2017-08-23 ENCOUNTER — Encounter: Payer: Self-pay | Admitting: Family Medicine

## 2017-08-23 VITALS — BP 120/72 | HR 74 | Temp 98.3°F | Resp 14 | Ht 66.0 in | Wt 219.0 lb

## 2017-08-23 DIAGNOSIS — N390 Urinary tract infection, site not specified: Secondary | ICD-10-CM

## 2017-08-23 LAB — URINALYSIS, ROUTINE W REFLEX MICROSCOPIC
BILIRUBIN URINE: NEGATIVE
GLUCOSE, UA: NEGATIVE
HGB URINE DIPSTICK: NEGATIVE
Hyaline Cast: NONE SEEN /LPF
Ketones, ur: NEGATIVE
NITRITE: NEGATIVE
PROTEIN: NEGATIVE
Specific Gravity, Urine: 1.025 (ref 1.001–1.03)
pH: 6.5 (ref 5.0–8.0)

## 2017-08-23 LAB — MICROSCOPIC MESSAGE

## 2017-08-23 MED ORDER — PHENAZOPYRIDINE HCL 100 MG PO TABS: 100.0000 mg | ORAL_TABLET | Freq: Three times a day (TID) | ORAL | 0 refills | Status: DC | PRN

## 2017-08-23 MED ORDER — CIPROFLOXACIN HCL 500 MG PO TABS: 500.0000 mg | ORAL_TABLET | Freq: Two times a day (BID) | ORAL | 0 refills | Status: DC

## 2017-08-23 NOTE — Progress Notes (Signed)
   Subjective:    Patient ID: Katherine Woodward, female    DOB: 14-Feb-1956, 61 y.o.   MRN: [id number]  Patient presents for Urinary Frequency  Patient here with urinary frequency and pressure, since Sunday.  she had a urinary tract infection back in September treated With Keflex 500 mg 4 times daily. No gross blood but has miscropic hematuria     Review Of Systems:  GEN- denies fatigue, fever, weight loss,weakness, recent illness HEENT- denies eye drainage, change in vision, nasal discharge, CVS- denies chest pain, palpitations RESP- denies SOB, cough, wheeze ABD- denies N/V, change in stools, abd pain GU- denies dysuria, hematuria, dribbling, incontinence MSK- denies joint pain, muscle aches, injury Neuro- denies headache, dizziness, syncope, seizure activity       Objective:    BP 120/72   Pulse 74   Temp 98.3 F (36.8 C) (Oral)   Resp 14   Ht 5\' 6"  (1.676 m)   Wt 219 lb (99.3 kg)   SpO2 98%   BMI 35.35 kg/m  GEN- NAD, alert and oriented x3 HEENT- PERRL, EOMI, non injected sclera, pink conjunctiva, MMM, oropharynx clear CVS- RRR, no murmur RESP-CTAB ABD-NABS,soft,NT,ND, no CVA tenderness  Pulses- Radial 2+        Assessment & Plan:      Problem List Items Addressed This Visit    None    Visit Diagnoses    Urinary tract infection without hematuria, site unspecified    -  Primary   Treat with Cipro and pyridium, urine culture to be done, concern she did not clear completely after September or may have resistant bug    Relevant Medications   phenazopyridine (PYRIDIUM) 100 MG tablet   Other Relevant Orders   Urinalysis, Routine w reflex microscopic   Urine Culture      Note: This dictation was prepared with Dragon dictation along with smaller phrase technology. Any transcriptional errors that result from this process are unintentional.

## 2017-08-23 NOTE — Patient Instructions (Addendum)
Start antibiotics Take AZO  F/U Change F/U TO 1st week January

## 2017-08-26 LAB — URINE CULTURE
MICRO NUMBER:: 81247863
SPECIMEN QUALITY:: ADEQUATE

## 2017-08-31 ENCOUNTER — Ambulatory Visit: Payer: Managed Care, Other (non HMO) | Admitting: Family Medicine

## 2017-09-26 ENCOUNTER — Other Ambulatory Visit: Payer: Self-pay | Admitting: Family Medicine

## 2017-10-03 ENCOUNTER — Telehealth: Payer: Self-pay | Admitting: *Deleted

## 2017-10-03 MED ORDER — LIRAGLUTIDE -WEIGHT MANAGEMENT 18 MG/3ML ~~LOC~~ SOPN: 3.0000 mg | PEN_INJECTOR | Freq: Every day | SUBCUTANEOUS | 11 refills | Status: DC

## 2017-10-03 NOTE — Telephone Encounter (Signed)
Received PA determination.   PA [id number] approved 09/03/2017- 10/03/2018.  Pharmacy made aware.

## 2017-10-03 NOTE — Telephone Encounter (Signed)
Received request from pharmacy for [place] on Saxenda.   PA submitted.   Dx: E66.9- Obesity.   Weight 7/18- 240lbs  Weight 11/6- 219lbs  Weight loss- 8.75%

## 2017-10-09 ENCOUNTER — Other Ambulatory Visit: Payer: Self-pay | Admitting: Family Medicine

## 2018-04-05 ENCOUNTER — Other Ambulatory Visit: Payer: Self-pay | Admitting: Family Medicine

## 2018-04-05 DIAGNOSIS — Z1231 Encounter for screening mammogram for malignant neoplasm of breast: Secondary | ICD-10-CM

## 2018-05-05 ENCOUNTER — Encounter: Payer: Self-pay | Admitting: Family Medicine

## 2018-05-19 ENCOUNTER — Ambulatory Visit
Admission: RE | Admit: 2018-05-19 | Discharge: 2018-05-19 | Disposition: A | Discharge status: Home / Self Care | Payer: Managed Care, Other (non HMO) | Source: Ambulatory Visit | Attending: Family Medicine | Admitting: Family Medicine

## 2018-05-19 DIAGNOSIS — Z1231 Encounter for screening mammogram for malignant neoplasm of breast: Secondary | ICD-10-CM

## 2018-05-22 ENCOUNTER — Other Ambulatory Visit: Payer: Self-pay | Admitting: *Deleted

## 2018-05-22 ENCOUNTER — Encounter: Payer: Self-pay | Admitting: Family Medicine

## 2018-05-22 ENCOUNTER — Other Ambulatory Visit: Payer: Self-pay | Admitting: Family Medicine

## 2018-05-22 DIAGNOSIS — Z6839 Body mass index (BMI) 39.0-39.9, adult: Secondary | ICD-10-CM

## 2018-05-22 DIAGNOSIS — E038 Other specified hypothyroidism: Secondary | ICD-10-CM

## 2018-05-22 DIAGNOSIS — Z0000 Encounter for general adult medical examination without abnormal findings: Secondary | ICD-10-CM

## 2018-05-22 DIAGNOSIS — R928 Other abnormal and inconclusive findings on diagnostic imaging of breast: Secondary | ICD-10-CM

## 2018-05-22 DIAGNOSIS — E785 Hyperlipidemia, unspecified: Secondary | ICD-10-CM

## 2018-05-24 ENCOUNTER — Ambulatory Visit
Admission: RE | Admit: 2018-05-24 | Discharge: 2018-05-24 | Disposition: A | Discharge status: Home / Self Care | Payer: Managed Care, Other (non HMO) | Source: Ambulatory Visit | Attending: Family Medicine | Admitting: Family Medicine

## 2018-05-24 ENCOUNTER — Other Ambulatory Visit: Payer: Self-pay | Admitting: Family Medicine

## 2018-05-24 DIAGNOSIS — R928 Other abnormal and inconclusive findings on diagnostic imaging of breast: Secondary | ICD-10-CM

## 2018-05-24 DIAGNOSIS — N6320 Unspecified lump in the left breast, unspecified quadrant: Secondary | ICD-10-CM

## 2018-05-28 ENCOUNTER — Encounter: Payer: Self-pay | Admitting: Family Medicine

## 2018-05-29 MED ORDER — CYCLOBENZAPRINE HCL 10 MG PO TABS: 10.0000 mg | ORAL_TABLET | Freq: Every day | ORAL | 0 refills | Status: DC

## 2018-05-29 NOTE — Telephone Encounter (Signed)
Ok to refill 

## 2018-05-31 ENCOUNTER — Ambulatory Visit
Admission: RE | Admit: 2018-05-31 | Discharge: 2018-05-31 | Disposition: A | Discharge status: Home / Self Care | Payer: Managed Care, Other (non HMO) | Source: Ambulatory Visit | Attending: Family Medicine | Admitting: Family Medicine

## 2018-05-31 DIAGNOSIS — N6320 Unspecified lump in the left breast, unspecified quadrant: Secondary | ICD-10-CM

## 2018-06-06 ENCOUNTER — Other Ambulatory Visit: Payer: Managed Care, Other (non HMO)

## 2018-06-06 DIAGNOSIS — Z0000 Encounter for general adult medical examination without abnormal findings: Secondary | ICD-10-CM

## 2018-06-06 DIAGNOSIS — E038 Other specified hypothyroidism: Secondary | ICD-10-CM

## 2018-06-06 DIAGNOSIS — E785 Hyperlipidemia, unspecified: Secondary | ICD-10-CM

## 2018-06-09 ENCOUNTER — Ambulatory Visit (INDEPENDENT_AMBULATORY_CARE_PROVIDER_SITE_OTHER): Payer: Managed Care, Other (non HMO) | Admitting: Family Medicine

## 2018-06-09 ENCOUNTER — Encounter: Payer: Self-pay | Admitting: Family Medicine

## 2018-06-09 VITALS — BP 122/78 | HR 63 | Temp 97.6°F | Resp 15 | Ht 66.0 in | Wt 215.1 lb

## 2018-06-09 DIAGNOSIS — Z124 Encounter for screening for malignant neoplasm of cervix: Secondary | ICD-10-CM

## 2018-06-09 DIAGNOSIS — E038 Other specified hypothyroidism: Secondary | ICD-10-CM

## 2018-06-09 DIAGNOSIS — R5383 Other fatigue: Secondary | ICD-10-CM

## 2018-06-09 DIAGNOSIS — E6609 Other obesity due to excess calories: Secondary | ICD-10-CM

## 2018-06-09 DIAGNOSIS — D229 Melanocytic nevi, unspecified: Secondary | ICD-10-CM

## 2018-06-09 DIAGNOSIS — Z0000 Encounter for general adult medical examination without abnormal findings: Secondary | ICD-10-CM

## 2018-06-09 DIAGNOSIS — Z1159 Encounter for screening for other viral diseases: Secondary | ICD-10-CM

## 2018-06-09 DIAGNOSIS — E785 Hyperlipidemia, unspecified: Secondary | ICD-10-CM

## 2018-06-09 DIAGNOSIS — L821 Other seborrheic keratosis: Secondary | ICD-10-CM | POA: Diagnosis not present

## 2018-06-09 DIAGNOSIS — E559 Vitamin D deficiency, unspecified: Secondary | ICD-10-CM

## 2018-06-09 DIAGNOSIS — G5682 Other specified mononeuropathies of left upper limb: Secondary | ICD-10-CM

## 2018-06-09 DIAGNOSIS — Z6834 Body mass index (BMI) 34.0-34.9, adult: Secondary | ICD-10-CM

## 2018-06-09 MED ORDER — PREDNISONE 20 MG PO TABS: 20.0000 mg | ORAL_TABLET | Freq: Every day | ORAL | 0 refills | Status: DC

## 2018-06-09 MED ORDER — HYDROCODONE-ACETAMINOPHEN 5-325 MG PO TABS: 1.0000 | ORAL_TABLET | Freq: Four times a day (QID) | ORAL | 0 refills | Status: DC | PRN

## 2018-06-09 NOTE — Progress Notes (Signed)
Subjective:    Patient ID: Katherine Woodward, female    DOB: 14-Feb-1956, 62 y.o.   MRN: [id number]  Patient presents for Annual Exam   Pt here for annual physical  Has decreased energy, not sleeping , weight was down to 200lbs May, so se came off her Saxenda for a little and now back on  And exercising again, weight up to 215lbs  Her hair seems brittle and dry  Other symptoms- Gets cold easily during the day , no hot flashes   Does not feel depressed, taking her wellbutrin    Has spot on post right shoulder- feels like a scab, has been present > 6 months, occasionally bleeds  Has had pain In post left shoulder, gets tingling randomly, doest feel when sitting reclined, but when she stands up will get the sensation. Has tried massage, yoga , no change in ROM. No change with activity or lifting, doesn't cause pain, no radiating symptoms   Colonoscopy /  Mammogram/   Reviewed labs- TSH normal, normal glucose, no anemia      Review Of Systems:  GEN- denies fatigue, fever, weight loss,weakness, recent illness HEENT- denies eye drainage, change in vision, nasal discharge, CVS- denies chest pain, palpitations RESP- denies SOB, cough, wheeze ABD- denies N/V, change in stools, abd pain GU- denies dysuria, hematuria, dribbling, incontinence MSK- + joint pain, muscle aches, injury Neuro- denies headache, dizziness, syncope, seizure activity       Objective:    BP 122/78   Pulse 63   Temp 97.6 F (36.4 C) (Oral)   Resp 15   Ht 5\' 6"  (1.676 m)   Wt 215 lb 2 oz (97.6 kg)   SpO2 99%   BMI 34.72 kg/m  GEN- NAD, alert and oriented x3 HEENT- PERRL, EOMI, non injected sclera, pink conjunctiva, MMM, oropharynx clear Neck- Supple, no thyromegaly CVS- RRR, no murmur RESP-CTAB GU- normal external genitalia, vaginal mucosa pink and moist, cervix visualized no growth, no blood form os, minimal thin clear discharge, no CMT, no ovarian masses, uterus normal size Rectum- normal tone, FOBT neg,  no external  ABD-NABS,soft,NT,ND MSK- FROM upper ext, spine, neg impingment signs in shoulder, rotator cuff in tact Skin- multiple nevi, right shoulder scaley flesh toned lesion,tiny scaley lesion on left nares EXT- No edema Pulses- Radial, DP- 2+        Assessment & Plan:      Problem List Items Addressed This Visit      Unprioritized   Hypothyroidism    TFT at goal no changes      Mild hyperlipidemia    Lipids improved Continue with dietary changes and Saxenda for weight loss      Obesity   Routine general medical examination at a health care facility    CPE done, prevention PAP Done Hep C screening Reviewed labs Immunizations UTD       Other Visit Diagnoses    Cervical cancer screening    -  Primary   Relevant Orders   Pap IG w/ reflex to HPV when ASC-U   Fatigue, unspecified type       likley MTF, add Vitamin D, B12 to labs, can take MVI   Relevant Orders   Vitamin B12   Seborrheic keratosis       S/P cyrotherapy, verbal consent, liquid nitrogen applied via spray gun 5-10 sec x 3 passes, bandage applied, tolerated wihtout diffuclty, discussed aftercare   Relevant Orders   Ambulatory referral to Dermatology   Multiple nevi  Relevant Orders   Ambulatory referral to Dermatology   Pinched nerve in shoulder, left       Normal exam, given prednisone to have on hand, discussed PT vs Ortho but very intermittant symptoms, normal exam, hold off   Need for hepatitis C screening test       Relevant Orders   Hepatitis C antibody   Vitamin D deficiency       Relevant Orders   Vitamin D, 25-hydroxy      Note: This dictation was prepared with Dragon dictation along with smaller phrase technology. Any transcriptional errors that result from this process are unintentional.

## 2018-06-09 NOTE — Patient Instructions (Addendum)
Referral to dermatology  We will call with Hep C results  Prednisone on hand for nerve  F/U 6 months

## 2018-06-10 ENCOUNTER — Encounter: Payer: Self-pay | Admitting: Family Medicine

## 2018-06-10 LAB — LIPID PANEL
Cholesterol: 211 mg/dL — ABNORMAL HIGH (ref <200)
HDL: 78 mg/dL (ref >50)
LDL Cholesterol (Calc): 117 mg/dL (calc) — ABNORMAL HIGH
NON-HDL CHOLESTEROL (CALC): 133 mg/dL — AB (ref <130)
Total CHOL/HDL Ratio: 2.7 (calc) (ref <5.0)
Triglycerides: 66 mg/dL (ref <150)

## 2018-06-10 LAB — CBC WITH DIFFERENTIAL/PLATELET
BASOS ABS: 72 {cells}/uL (ref 0–200)
Basophils Relative: 1.2 %
EOS ABS: 102 {cells}/uL (ref 15–500)
Eosinophils Relative: 1.7 %
HEMATOCRIT: 40.9 % (ref 35.0–45.0)
HEMOGLOBIN: 13.6 g/dL (ref 11.7–15.5)
LYMPHS ABS: 1458 {cells}/uL (ref 850–3900)
MCH: 30 pg (ref 27.0–33.0)
MCHC: 33.3 g/dL (ref 32.0–36.0)
MCV: 90.3 fL (ref 80.0–100.0)
MPV: 11.3 fL (ref 7.5–12.5)
Monocytes Relative: 6 %
NEUTROS ABS: 4008 {cells}/uL (ref 1500–7800)
Neutrophils Relative %: 66.8 %
Platelets: 294 10*3/uL (ref 140–400)
RBC: 4.53 10*6/uL (ref 3.80–5.10)
RDW: 12.9 % (ref 11.0–15.0)
Total Lymphocyte: 24.3 %
WBC mixed population: 360 cells/uL (ref 200–950)
WBC: 6 10*3/uL (ref 3.8–10.8)

## 2018-06-10 LAB — TEST AUTHORIZATION

## 2018-06-10 LAB — COMPLETE METABOLIC PANEL WITH GFR
AG Ratio: 1.4 (calc) (ref 1.0–2.5)
ALT: 13 U/L (ref 6–29)
AST: 13 U/L (ref 10–35)
Albumin: 4 g/dL (ref 3.6–5.1)
Alkaline phosphatase (APISO): 115 U/L (ref 33–130)
BUN: 17 mg/dL (ref 7–25)
CALCIUM: 9.2 mg/dL (ref 8.6–10.4)
CO2: 26 mmol/L (ref 20–32)
CREATININE: 0.84 mg/dL (ref 0.50–0.99)
Chloride: 103 mmol/L (ref 98–110)
GFR, EST AFRICAN AMERICAN: 88 mL/min/{1.73_m2} (ref >=60)
GFR, EST NON AFRICAN AMERICAN: 76 mL/min/{1.73_m2} (ref >=60)
GLUCOSE: 89 mg/dL (ref 65–99)
Globulin: 2.8 g/dL (calc) (ref 1.9–3.7)
Potassium: 4.4 mmol/L (ref 3.5–5.3)
Sodium: 138 mmol/L (ref 135–146)
TOTAL PROTEIN: 6.8 g/dL (ref 6.1–8.1)
Total Bilirubin: 0.4 mg/dL (ref 0.2–1.2)

## 2018-06-10 LAB — HEPATITIS C ANTIBODY
HEP C AB: NONREACTIVE
SIGNAL TO CUT-OFF: 0 (ref <1.00)

## 2018-06-10 LAB — TSH: TSH: 2.64 mIU/L (ref 0.40–4.50)

## 2018-06-10 LAB — VITAMIN D 25 HYDROXY (VIT D DEFICIENCY, FRACTURES): VIT D 25 HYDROXY: 29 ng/mL — AB (ref 30–100)

## 2018-06-10 LAB — VITAMIN B12: Vitamin B-12: 326 pg/mL (ref 200–1100)

## 2018-06-10 NOTE — Assessment & Plan Note (Signed)
Lipids improved Continue with dietary changes and Saxenda for weight loss

## 2018-06-10 NOTE — Assessment & Plan Note (Signed)
CPE done, prevention PAP Done Hep C screening Reviewed labs Immunizations UTD

## 2018-06-10 NOTE — Assessment & Plan Note (Signed)
TFT at goal no changes  

## 2018-06-12 LAB — PAP IG W/ RFLX HPV ASCU

## 2018-06-13 ENCOUNTER — Other Ambulatory Visit: Payer: Self-pay | Admitting: *Deleted

## 2018-06-13 DIAGNOSIS — E539 Vitamin B deficiency, unspecified: Secondary | ICD-10-CM

## 2018-06-13 MED ORDER — VITAMIN D (CHOLECALCIFEROL) 25 MCG (1000 UT) PO TABS: 1.0000 | ORAL_TABLET | Freq: Every day | ORAL | Status: DC

## 2018-06-13 MED ORDER — CYANOCOBALAMIN 1000 MCG/ML IJ SOLN
1000.0000 ug | INTRAMUSCULAR | Status: AC
  Administered 2018-06-15 – 2018-08-18 (×3): 1000 ug via INTRAMUSCULAR

## 2018-06-15 ENCOUNTER — Other Ambulatory Visit: Payer: Self-pay

## 2018-06-15 ENCOUNTER — Ambulatory Visit (INDEPENDENT_AMBULATORY_CARE_PROVIDER_SITE_OTHER): Payer: Managed Care, Other (non HMO) | Admitting: *Deleted

## 2018-06-15 DIAGNOSIS — E539 Vitamin B deficiency, unspecified: Secondary | ICD-10-CM | POA: Diagnosis not present

## 2018-06-15 NOTE — Progress Notes (Signed)
Patient seen in office for Vitamin B 12 injection.   Tolerated IM administration well in R arm.  

## 2018-06-30 ENCOUNTER — Other Ambulatory Visit: Payer: Self-pay | Admitting: *Deleted

## 2018-06-30 MED ORDER — SYNTHROID 112 MCG PO TABS: ORAL_TABLET | ORAL | 3 refills | Status: DC

## 2018-07-17 ENCOUNTER — Ambulatory Visit (INDEPENDENT_AMBULATORY_CARE_PROVIDER_SITE_OTHER): Payer: Managed Care, Other (non HMO) | Admitting: Family Medicine

## 2018-07-17 DIAGNOSIS — E539 Vitamin B deficiency, unspecified: Secondary | ICD-10-CM

## 2018-07-31 ENCOUNTER — Other Ambulatory Visit: Payer: Self-pay | Admitting: *Deleted

## 2018-07-31 MED ORDER — BUPROPION HCL ER (XL) 300 MG PO TB24: ORAL_TABLET | ORAL | 0 refills | Status: DC

## 2018-08-07 ENCOUNTER — Telehealth: Payer: Self-pay | Admitting: Family Medicine

## 2018-08-07 MED ORDER — BUPROPION HCL ER (XL) 300 MG PO TB24: ORAL_TABLET | ORAL | 0 refills | Status: DC

## 2018-08-07 NOTE — Telephone Encounter (Signed)
Pt called in about her wellbutrin script that was sent to [facility] she needs it sent for 90days not 30days if you have ?'s about anything she would like you to call her.

## 2018-08-18 ENCOUNTER — Ambulatory Visit (INDEPENDENT_AMBULATORY_CARE_PROVIDER_SITE_OTHER): Payer: Managed Care, Other (non HMO)

## 2018-08-18 DIAGNOSIS — E539 Vitamin B deficiency, unspecified: Secondary | ICD-10-CM | POA: Diagnosis not present

## 2018-08-18 NOTE — Progress Notes (Signed)
Patient came in today to receive her monthly B12 injection. B12 was injected in the right deltoid. She tolerated well.

## 2018-09-21 ENCOUNTER — Telehealth: Payer: Self-pay | Admitting: *Deleted

## 2018-09-21 MED ORDER — LIRAGLUTIDE -WEIGHT MANAGEMENT 18 MG/3ML ~~LOC~~ SOPN: 3.0000 mg | PEN_INJECTOR | Freq: Every day | SUBCUTANEOUS | 11 refills | Status: DC

## 2018-09-21 NOTE — Telephone Encounter (Signed)
Received request from pharmacy for [place] on Saxenda.   PA submitted.   Dx: E66.9- obesity.  Received immediate determination.   Case ID [id number] Approved11/02/2018- 09/21/2019.   Pharmacy made aware.   05/04/2017- 240lb 08/23/2017- 492DG 06/09/2018- 215lb 10.42% lost

## 2018-12-05 ENCOUNTER — Other Ambulatory Visit: Payer: Self-pay | Admitting: *Deleted

## 2018-12-05 MED ORDER — BUPROPION HCL ER (XL) 300 MG PO TB24: ORAL_TABLET | ORAL | 0 refills | Status: DC

## 2019-01-20 ENCOUNTER — Encounter: Payer: Self-pay | Admitting: Family Medicine

## 2019-03-01 ENCOUNTER — Encounter: Payer: Self-pay | Admitting: Family Medicine

## 2019-03-02 ENCOUNTER — Other Ambulatory Visit: Payer: Self-pay | Admitting: Family Medicine

## 2019-05-28 ENCOUNTER — Other Ambulatory Visit: Payer: Self-pay | Admitting: Family Medicine

## 2019-06-07 ENCOUNTER — Other Ambulatory Visit: Payer: Self-pay | Admitting: Family Medicine

## 2019-06-07 DIAGNOSIS — E038 Other specified hypothyroidism: Secondary | ICD-10-CM

## 2019-06-07 DIAGNOSIS — Z0000 Encounter for general adult medical examination without abnormal findings: Secondary | ICD-10-CM

## 2019-06-07 DIAGNOSIS — E785 Hyperlipidemia, unspecified: Secondary | ICD-10-CM

## 2019-06-08 ENCOUNTER — Other Ambulatory Visit: Payer: Self-pay

## 2019-06-08 ENCOUNTER — Other Ambulatory Visit: Payer: Managed Care, Other (non HMO)

## 2019-06-08 DIAGNOSIS — E785 Hyperlipidemia, unspecified: Secondary | ICD-10-CM

## 2019-06-08 DIAGNOSIS — Z0000 Encounter for general adult medical examination without abnormal findings: Secondary | ICD-10-CM

## 2019-06-08 DIAGNOSIS — E038 Other specified hypothyroidism: Secondary | ICD-10-CM

## 2019-06-09 LAB — CBC WITH DIFFERENTIAL/PLATELET
Absolute Monocytes: 429 cells/uL (ref 200–950)
Basophils Absolute: 60 cells/uL (ref 0–200)
Basophils Relative: 0.9 %
Eosinophils Absolute: 87 cells/uL (ref 15–500)
Eosinophils Relative: 1.3 %
HCT: 41.9 % (ref 35.0–45.0)
Hemoglobin: 13.5 g/dL (ref 11.7–15.5)
Lymphs Abs: 1441 cells/uL (ref 850–3900)
MCH: 29.2 pg (ref 27.0–33.0)
MCHC: 32.2 g/dL (ref 32.0–36.0)
MCV: 90.7 fL (ref 80.0–100.0)
MPV: 11.4 fL (ref 7.5–12.5)
Monocytes Relative: 6.4 %
Neutro Abs: 4683 cells/uL (ref 1500–7800)
Neutrophils Relative %: 69.9 %
Platelets: 334 10*3/uL (ref 140–400)
RBC: 4.62 10*6/uL (ref 3.80–5.10)
RDW: 12.7 % (ref 11.0–15.0)
Total Lymphocyte: 21.5 %
WBC: 6.7 10*3/uL (ref 3.8–10.8)

## 2019-06-09 LAB — COMPREHENSIVE METABOLIC PANEL
AG Ratio: 1.5 (calc) (ref 1.0–2.5)
ALT: 14 U/L (ref 6–29)
AST: 16 U/L (ref 10–35)
Albumin: 4 g/dL (ref 3.6–5.1)
Alkaline phosphatase (APISO): 105 U/L (ref 37–153)
BUN: 18 mg/dL (ref 7–25)
CO2: 25 mmol/L (ref 20–32)
Calcium: 9.4 mg/dL (ref 8.6–10.4)
Chloride: 103 mmol/L (ref 98–110)
Creat: 0.95 mg/dL (ref 0.50–0.99)
Globulin: 2.6 g/dL (calc) (ref 1.9–3.7)
Glucose, Bld: 87 mg/dL (ref 65–99)
Potassium: 4.5 mmol/L (ref 3.5–5.3)
Sodium: 139 mmol/L (ref 135–146)
Total Bilirubin: 0.4 mg/dL (ref 0.2–1.2)
Total Protein: 6.6 g/dL (ref 6.1–8.1)

## 2019-06-09 LAB — LIPID PANEL
Cholesterol: 214 mg/dL — ABNORMAL HIGH (ref <200)
HDL: 67 mg/dL (ref >=50)
LDL Cholesterol (Calc): 128 mg/dL (calc) — ABNORMAL HIGH
Non-HDL Cholesterol (Calc): 147 mg/dL (calc) — ABNORMAL HIGH (ref <130)
Total CHOL/HDL Ratio: 3.2 (calc) (ref <5.0)
Triglycerides: 87 mg/dL (ref <150)

## 2019-06-09 LAB — TSH: TSH: 1.74 mIU/L (ref 0.40–4.50)

## 2019-06-12 ENCOUNTER — Other Ambulatory Visit: Payer: Self-pay

## 2019-06-13 ENCOUNTER — Ambulatory Visit (INDEPENDENT_AMBULATORY_CARE_PROVIDER_SITE_OTHER): Payer: Managed Care, Other (non HMO) | Admitting: Family Medicine

## 2019-06-13 ENCOUNTER — Encounter: Payer: Self-pay | Admitting: Family Medicine

## 2019-06-13 VITALS — BP 128/62 | HR 68 | Temp 98.3°F | Resp 14 | Ht 66.0 in | Wt 234.0 lb

## 2019-06-13 DIAGNOSIS — Z0001 Encounter for general adult medical examination with abnormal findings: Secondary | ICD-10-CM

## 2019-06-13 DIAGNOSIS — Z23 Encounter for immunization: Secondary | ICD-10-CM

## 2019-06-13 DIAGNOSIS — G8929 Other chronic pain: Secondary | ICD-10-CM

## 2019-06-13 DIAGNOSIS — E785 Hyperlipidemia, unspecified: Secondary | ICD-10-CM

## 2019-06-13 DIAGNOSIS — M79672 Pain in left foot: Secondary | ICD-10-CM

## 2019-06-13 DIAGNOSIS — E038 Other specified hypothyroidism: Secondary | ICD-10-CM

## 2019-06-13 DIAGNOSIS — M545 Low back pain, unspecified: Secondary | ICD-10-CM

## 2019-06-13 DIAGNOSIS — Z0000 Encounter for general adult medical examination without abnormal findings: Secondary | ICD-10-CM

## 2019-06-13 DIAGNOSIS — M722 Plantar fascial fibromatosis: Secondary | ICD-10-CM

## 2019-06-13 DIAGNOSIS — Z6837 Body mass index (BMI) 37.0-37.9, adult: Secondary | ICD-10-CM

## 2019-06-13 MED ORDER — SYNTHROID 112 MCG PO TABS: ORAL_TABLET | ORAL | 3 refills | Status: AC

## 2019-06-13 MED ORDER — SAXENDA 18 MG/3ML ~~LOC~~ SOPN: 3.0000 mg | PEN_INJECTOR | Freq: Every day | SUBCUTANEOUS | 11 refills | Status: DC

## 2019-06-13 MED ORDER — HYDROCODONE-ACETAMINOPHEN 5-325 MG PO TABS: 1.0000 | ORAL_TABLET | Freq: Four times a day (QID) | ORAL | 0 refills | Status: AC | PRN

## 2019-06-13 MED ORDER — CYCLOBENZAPRINE HCL 10 MG PO TABS: 10.0000 mg | ORAL_TABLET | Freq: Every day | ORAL | 0 refills | Status: AC

## 2019-06-13 MED ORDER — BUPROPION HCL ER (XL) 300 MG PO TB24: ORAL_TABLET | ORAL | 3 refills | Status: DC

## 2019-06-13 MED ORDER — BD PEN NEEDLE MICRO U/F 32G X 6 MM MISC: 2 refills | Status: DC

## 2019-06-13 NOTE — Patient Instructions (Addendum)
F/U  1 year for year for physical Flu shot given

## 2019-06-13 NOTE — Progress Notes (Signed)
Subjective:    Patient ID: Katherine Woodward, female    DOB: March 30, 1956, 63 y.o.   MRN: [id number]  Patient presents for Annual Exam (has had labs)  Patient here for complete physical exam.  Medications and history reviewed. Fasting labs recently done were reviewed at the bedside  Hypothyroidism thyroid function studies are at goal  Hyperlipidemia-[place] all is mildly elevated from last year  Has history of B12 deficiency in the setting of her previous gastric bypass was on injections didn't feel any different wants to hold off on injections for now    Vitamin D deficiency on vitamin D supplements daily  Due for mammogram for breast cancer screening  Obesity she has been on Saxenda- was off for a couple months, restarted inJuly, down 5lbs from heaviest   Chronic pain-needs pain medication and flexeril refilled   ETOH- drinks 1 hard liquor a week now  Left Heel pain on and off for past couple of weeks, no specific injury. Tries to stretch it Walks 3 miles a day, has pain during the day not with exercise , but does walk barefoot in the house  Colonoscopy up-to-date due in 2021 Cervical cancer screening up-to-date   Wants to go every 2 years for Mammogram     No vision or hearing changes  Review Of Systems:  GEN- denies fatigue, fever, weight loss,weakness, recent illness HEENT- denies eye drainage, change in vision, nasal discharge, CVS- denies chest pain, palpitations RESP- denies SOB, cough, wheeze ABD- denies N/V, change in stools, abd pain GU- denies dysuria, hematuria, dribbling, incontinence MSK- + joint pain,denies  muscle aches, injury Neuro- denies headache, dizziness, syncope, seizure activity       Objective:    BP 128/62   Pulse 68   Temp 98.3 F (36.8 C) (Oral)   Resp 14   Ht 5\' 6"  (1.676 m)   Wt 234 lb (106.1 kg)   SpO2 98%   BMI 37.77 kg/m  GEN- NAD, alert and oriented x3,obese HEENT- PERRL, EOMI, non injected sclera, pink conjunctiva, MMM,  oropharynx clear, TM clear no effusion, nares clear  Neck- Supple, no thyromegaly CVS- RRR, no murmur RESP-CTAB ABD-NABS,soft,NT,ND EXT- No edema Psych- normal affect and mood MSK- mild TTP left heel, no swelling, FROM ankles/foot bilat  Pulses- Radial, DP- 2+        Assessment & Plan:      Problem List Items Addressed This Visit      Unprioritized   Chronic back pain   Hypothyroidism   Mild hyperlipidemia   Obesity   Routine general medical examination at a health care facility - Primary    CPE done, fasting labs reviewed FLu shot given Plan for mammogram in  2021, she will call if she notices any breast lump or growth  TFT at goal  Hyperlipidemia wit obesity work on dietary changes, continue Saxenda has lost 5lbs since restarting continues to benefit from this medication  Mood disorder maintained with wellutrin no changes  Heel pain possible plantar fascitis vs a heel spur, will try conservative treatment with stretching icing, NSAIDS- topical or oral first, decrease AM walk down to 1 mile or so.  If not improved referral to podiatry  Chronic back pain- refilled norco and flexeril, rare use, does not use with ETOH       Other Visit Diagnoses    Plantar fasciitis, left       Pain of left heel          Note: This dictation  was prepared with Dragon dictation along with smaller phrase technology. Any transcriptional errors that result from this process are unintentional.

## 2019-06-13 NOTE — Assessment & Plan Note (Signed)
CPE done, fasting labs reviewed FLu shot given Plan for mammogram in  2021, she will call if she notices any breast lump or growth  TFT at goal  Hyperlipidemia wit obesity work on dietary changes, continue Kirke Shaggy has lost 5lbs since restarting continues to benefit from this medication  Mood disorder maintained with wellutrin no changes  Heel pain possible plantar fascitis vs a heel spur, will try conservative treatment with stretching icing, NSAIDS- topical or oral first, decrease AM walk down to 1 mile or so.  If not improved referral to podiatry  Chronic back pain- refilled norco and flexeril, rare use, does not use with ETOH

## 2019-07-18 ENCOUNTER — Other Ambulatory Visit: Payer: Self-pay

## 2019-07-18 ENCOUNTER — Ambulatory Visit (INDEPENDENT_AMBULATORY_CARE_PROVIDER_SITE_OTHER): Payer: Managed Care, Other (non HMO) | Admitting: Family Medicine

## 2019-07-18 ENCOUNTER — Encounter: Payer: Self-pay | Admitting: Family Medicine

## 2019-07-18 VITALS — BP 128/72 | HR 68 | Temp 98.4°F | Resp 14 | Ht 66.0 in | Wt 239.0 lb

## 2019-07-18 DIAGNOSIS — G5682 Other specified mononeuropathies of left upper limb: Secondary | ICD-10-CM

## 2019-07-18 MED ORDER — GABAPENTIN 100 MG PO CAPS: 100.0000 mg | ORAL_CAPSULE | Freq: Three times a day (TID) | ORAL | 3 refills | Status: DC

## 2019-07-18 MED ORDER — GABAPENTIN 100 MG PO CAPS: ORAL_CAPSULE | ORAL | 3 refills | Status: AC

## 2019-07-18 NOTE — Progress Notes (Signed)
   Subjective:    Patient ID: Katherine Woodward, female    DOB: 01/14/1956, 63 y.o.   MRN: [id number]  Patient presents for L Shoulder Pain (x1 year- intermittent pain/ tingling in shoulder and arm)   Left shoulder pain for over a year, feels a pinching sensation along with tingling and pain along the shoulder blade but is now radiating across towards her thoracic spine region.  Should not have any pain when she moves her neck or any pain into the rotator cuff region or biceps.  She denies any tingling numbness of the fingertips.  She is able to do yoga without any difficulty but does have pain daily though intermittent.  She is tried over-the-counter anti-inflammatories acetaminophen topical rubs she is even tried her Flexeril and nothing is help.  She would like to proceed with further evaluation   Review Of Systems:  GEN- denies fatigue, fever, weight loss,weakness, recent illness HEENT- denies eye drainage, change in vision, nasal discharge, CVS- denies chest pain, palpitations RESP- denies SOB, cough, wheeze ABD- denies N/V, change in stools, abd pain GU- denies dysuria, hematuria, dribbling, incontinence MSK- + joint pain, muscle aches, injury Neuro- denies headache, dizziness, syncope, seizure activity       Objective:    BP 128/72   Pulse 68   Temp 98.4 F (36.9 C) (Oral)   Resp 14   Ht 5\' 6"  (1.676 m)   Wt 239 lb (108.4 kg)   SpO2 97%   BMI 38.58 kg/m  GEN- NAD, alert and oriented x3 Neck- Supple, CVS- RRR, no murmur RESP-CTAB MSK- FROM upper ext, rotator cuff in tact, no winged scapula, FROM neck  Neuro-normal tone UE, sensation in tact, normal monofilament hands  Pulses- Radial, DP- 2+        Assessment & Plan:      Problem List Items Addressed This Visit    None    Visit Diagnoses    Suprascapular entrapment neuropathy of left side    -  Primary   concern for pinched nerve, referral to ortho, in meantime start gabapentin 100-200mg  at bedtime for nerve pain    Relevant Medications   gabapentin (NEURONTIN) 100 MG capsule   Other Relevant Orders   Ambulatory referral to Orthopedic Surgery      Note: This dictation was prepared with Dragon dictation along with smaller phrase technology. Any transcriptional errors that result from this process are unintentional.

## 2019-07-18 NOTE — Patient Instructions (Addendum)
Referral to orthopedics  Gabapentin 100-200mg  at bedtime F/U as previous

## 2019-08-31 ENCOUNTER — Telehealth: Payer: Self-pay | Admitting: *Deleted

## 2019-08-31 MED ORDER — SAXENDA 18 MG/3ML ~~LOC~~ SOPN: 3.0000 mg | PEN_INJECTOR | Freq: Every day | SUBCUTANEOUS | 11 refills | Status: DC

## 2019-08-31 NOTE — Telephone Encounter (Signed)
Received request from pharmacy for [place] on Saxenda.   PA submitted.   Dx: E66.09- obesity.   Received immediate determination.   PA approved 08/01/2019- 08/30/2020.

## 2019-12-20 ENCOUNTER — Other Ambulatory Visit: Payer: Self-pay | Admitting: Family Medicine

## 2019-12-20 DIAGNOSIS — Z1231 Encounter for screening mammogram for malignant neoplasm of breast: Secondary | ICD-10-CM

## 2019-12-25 ENCOUNTER — Ambulatory Visit
Admission: RE | Admit: 2019-12-25 | Discharge: 2019-12-25 | Disposition: A | Discharge status: Home / Self Care | Payer: 59 | Source: Ambulatory Visit | Attending: Family Medicine | Admitting: Family Medicine

## 2019-12-25 ENCOUNTER — Other Ambulatory Visit: Payer: Self-pay

## 2019-12-25 DIAGNOSIS — Z1231 Encounter for screening mammogram for malignant neoplasm of breast: Secondary | ICD-10-CM

## 2020-01-28 ENCOUNTER — Other Ambulatory Visit: Payer: Self-pay | Admitting: Family Medicine

## 2020-02-25 ENCOUNTER — Other Ambulatory Visit: Payer: Self-pay | Admitting: Family Medicine

## 2020-08-09 ENCOUNTER — Telehealth: Payer: Self-pay | Admitting: Family Medicine

## 2020-08-09 NOTE — Telephone Encounter (Signed)
Received fax from covermymeds needing a Prior auth Liraglutide -Weight Management (SAXENDA) 18 MG/3ML SOPN Key [id number]   IMPORTANT INFORMATION for Saxenda renewal Your patient's prior authorization for saxenda is about to expire. You may need to schedule a follow up visit with your patient; updated patient weight may be required for reauthorization.  [id number]

## 2020-08-11 NOTE — Telephone Encounter (Signed)
Please contact pt, I thoght she had moved Otherwise it been > 1 year and she needs OV before meds can be sent in

## 2020-08-19 NOTE — Telephone Encounter (Signed)
She said to disregard the request bc her new Dr is taking care of it.

## 2020-08-19 NOTE — Telephone Encounter (Signed)
Noted  

## 2020-08-26 ENCOUNTER — Other Ambulatory Visit: Payer: Self-pay | Admitting: Family Medicine

## 2020-08-31 IMAGING — MG DIGITAL SCREENING BILAT W/ TOMO W/ CAD
8 series · 8 of 24 positions shown · non-contrast
Comparison: Previous exam(s).

ACR Breast Density Category a: The breast tissue is almost entirely
fatty.

CLINICAL DATA: Screening.

EXAM:
DIGITAL SCREENING BILATERAL MAMMOGRAM WITH TOMO AND CAD

[R MLO synth-2D]
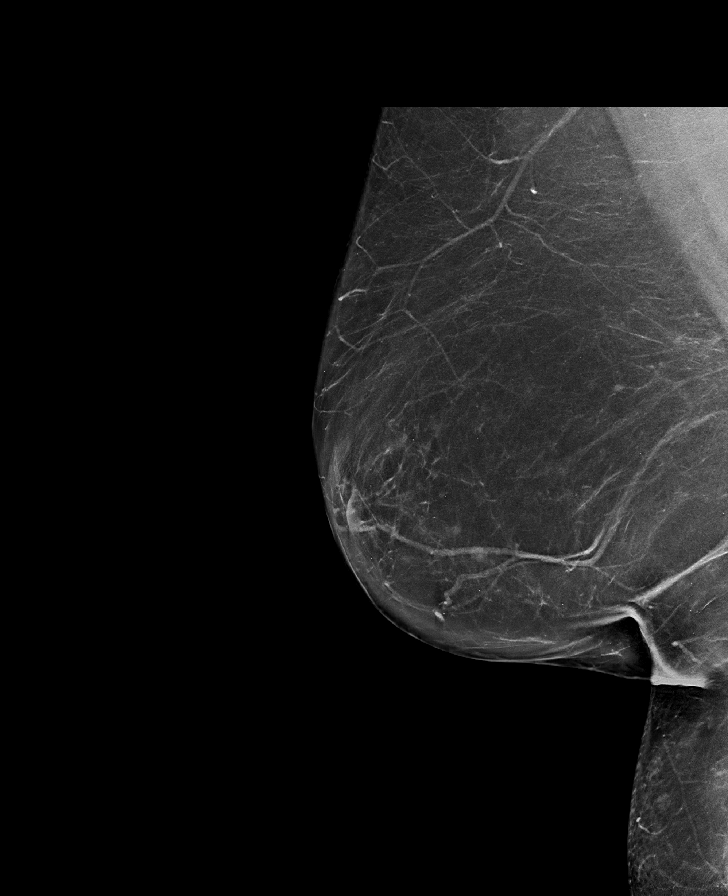

[R CC synth-2D]
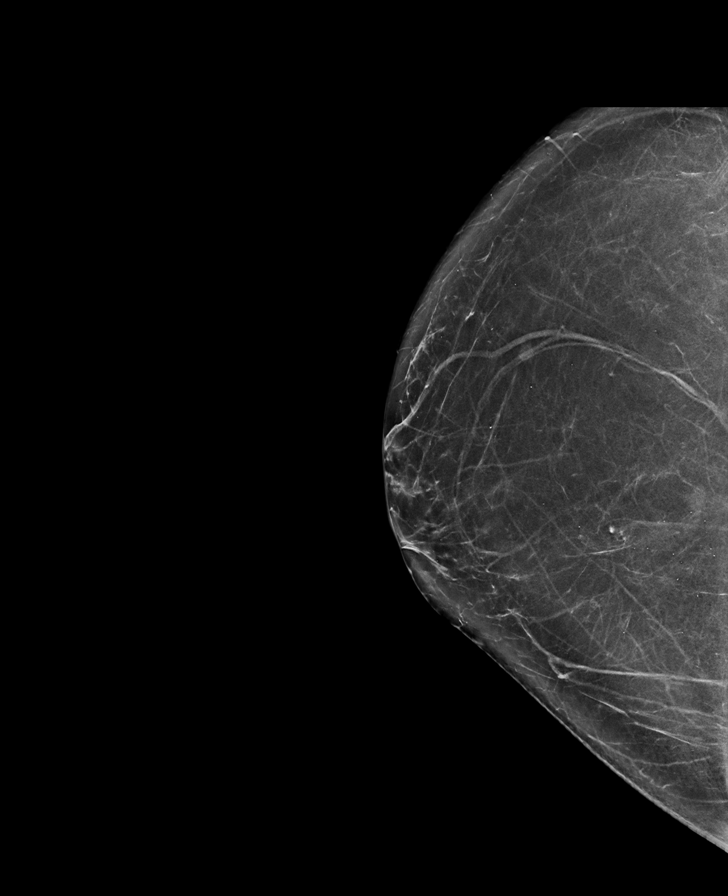

[L CC synth-2D]
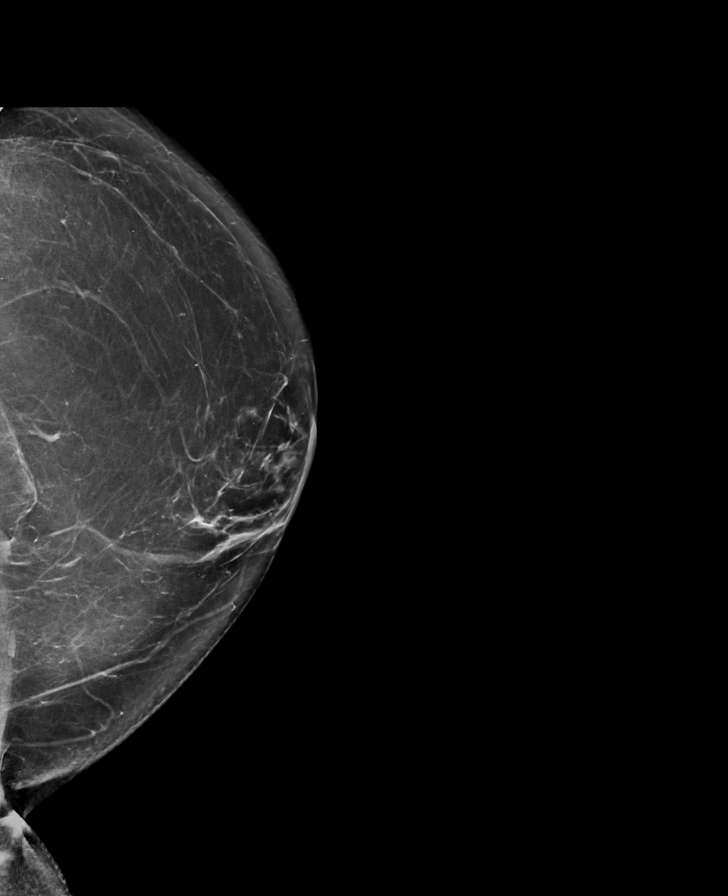

[L MLO synth-2D]
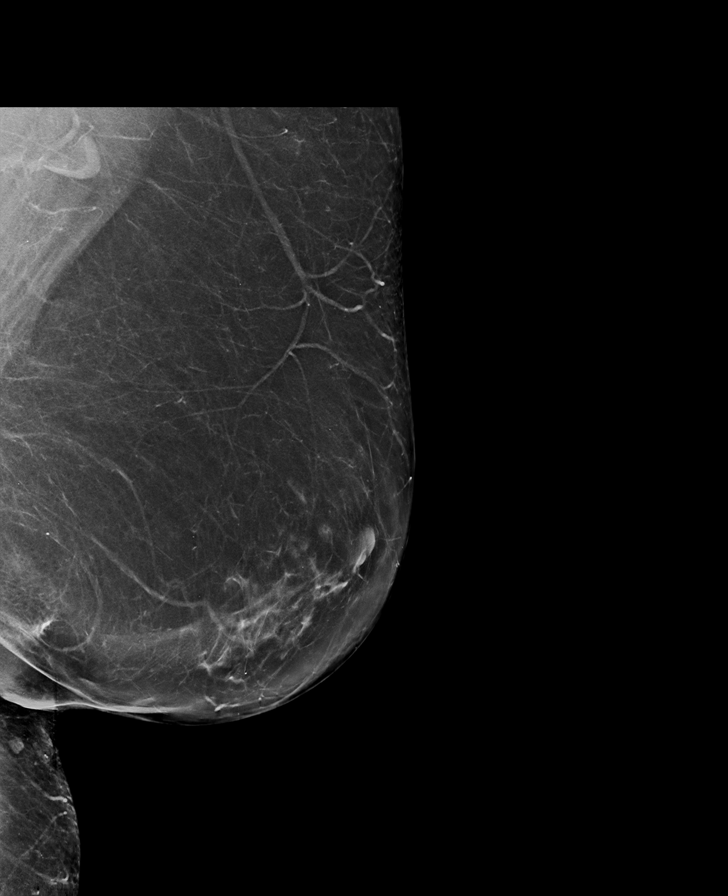

[R CC tomo · tomo slice 37/74.0]
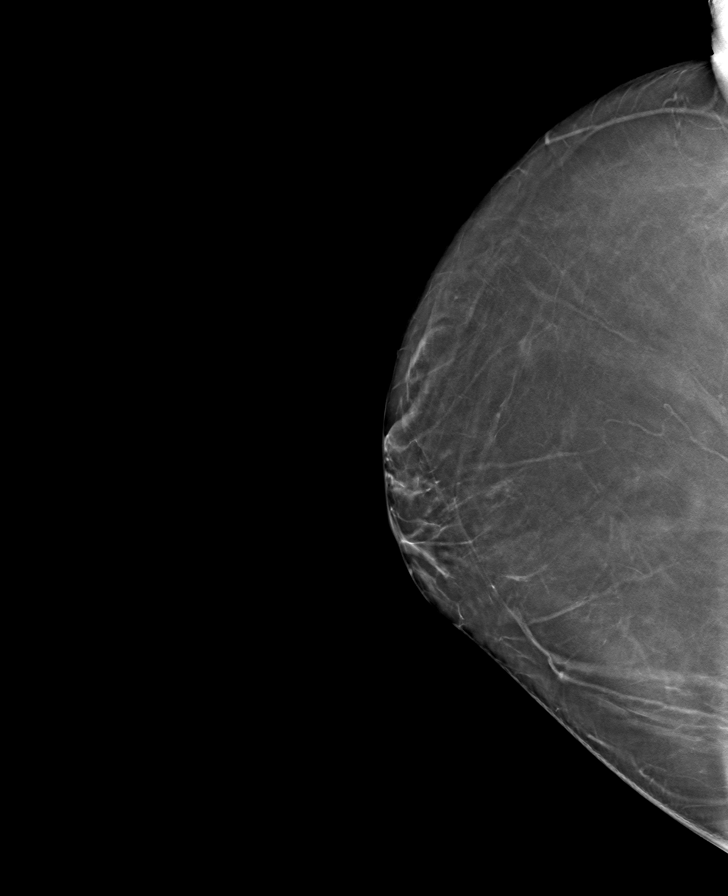

[L MLO tomo · tomo slice 43/85.0]
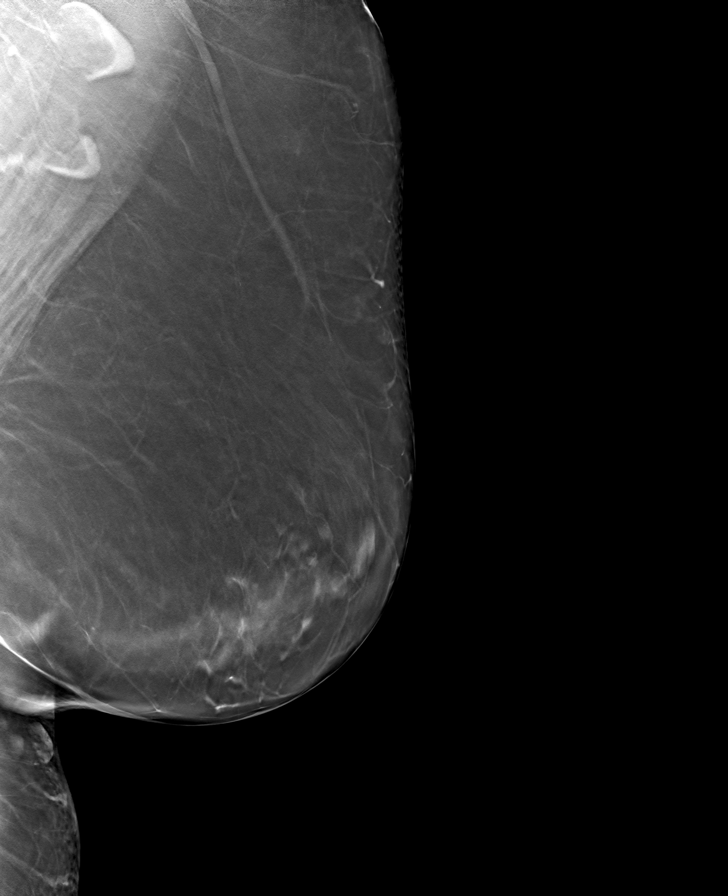

[L CC tomo · tomo slice 39/78.0]
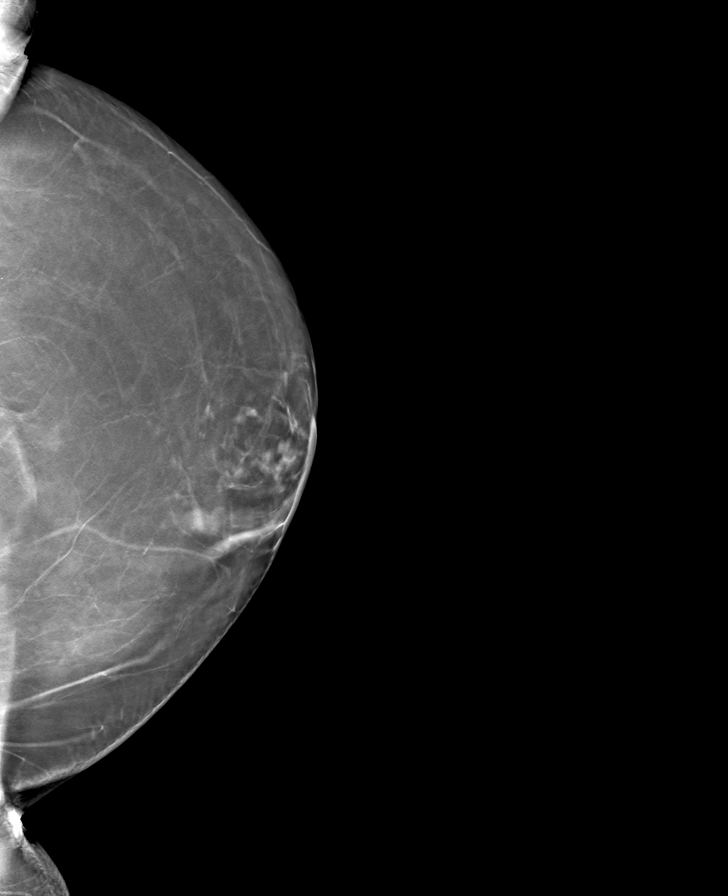

[R MLO tomo · tomo slice 43/85.0]
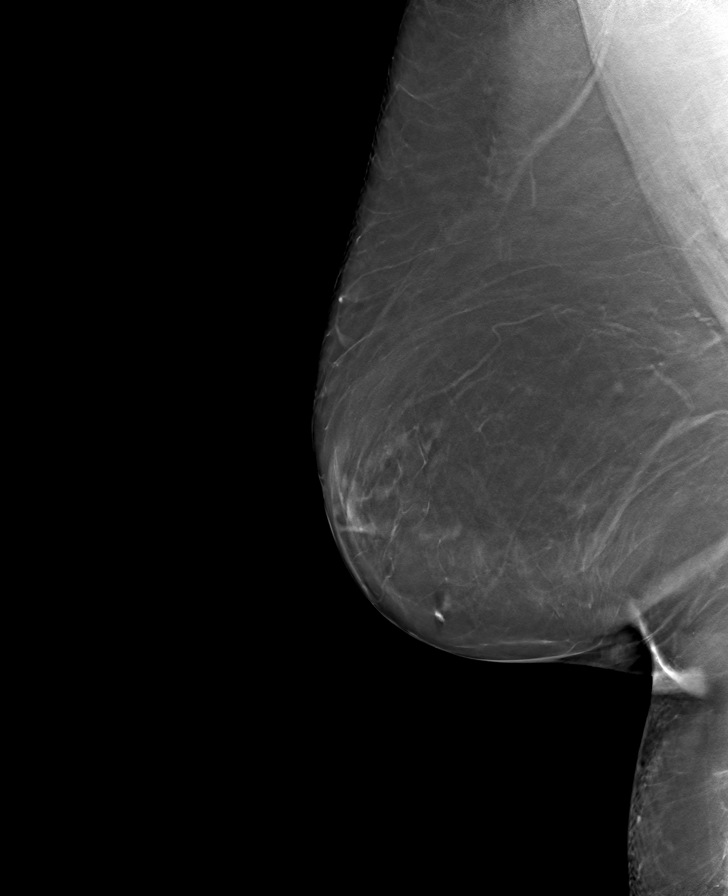

[8 of 24 positions shown; findings below may reference images not displayed]

FINDINGS: There are no findings suspicious for malignancy. Images were
processed with CAD.
IMPRESSION: No mammographic evidence of malignancy. A result letter of this
screening mammogram will be mailed directly to the patient.

RECOMMENDATION:
Screening mammogram in one year. (Code:[id number]-[id number])

BI-RADS CATEGORY  1: Negative.

## 2020-11-24 ENCOUNTER — Other Ambulatory Visit: Payer: Self-pay | Admitting: Family Medicine
